# Patient Record
Sex: Female | Born: 1935
Health system: Southern US, Community
[De-identification: ages and names within clinical notes are randomized; demographics above are authoritative.]

## PROBLEM LIST (undated history)

## (undated) DIAGNOSIS — C801 Malignant (primary) neoplasm, unspecified: Secondary | ICD-10-CM

## (undated) DIAGNOSIS — T4145XA Adverse effect of unspecified anesthetic, initial encounter: Secondary | ICD-10-CM

## (undated) DIAGNOSIS — Z780 Asymptomatic menopausal state: Secondary | ICD-10-CM

## (undated) DIAGNOSIS — M1990 Unspecified osteoarthritis, unspecified site: Secondary | ICD-10-CM

## (undated) DIAGNOSIS — I10 Essential (primary) hypertension: Secondary | ICD-10-CM

## (undated) DIAGNOSIS — I83899 Varicose veins of unspecified lower extremities with other complications: Secondary | ICD-10-CM

## (undated) DIAGNOSIS — E7800 Pure hypercholesterolemia, unspecified: Secondary | ICD-10-CM

## (undated) DIAGNOSIS — K219 Gastro-esophageal reflux disease without esophagitis: Secondary | ICD-10-CM

## (undated) HISTORY — PX: TONSILLECTOMY: SUR1361

## (undated) HISTORY — DX: Asymptomatic menopausal state: Z78.0

## (undated) HISTORY — DX: Pure hypercholesterolemia, unspecified: E78.00

## (undated) HISTORY — PX: EYE SURGERY: SHX253

## (undated) HISTORY — DX: Essential (primary) hypertension: I10

---

## 1963-03-04 HISTORY — PX: APPENDECTOMY: SHX54

## 1963-03-04 HISTORY — PX: TUBAL LIGATION: SHX77

## 1978-03-03 HISTORY — PX: CHOLECYSTECTOMY: SHX55

## 1997-06-16 ENCOUNTER — Ambulatory Visit (HOSPITAL_COMMUNITY): Admission: RE | Admit: 1997-06-16 | Discharge: 1997-06-16 | Payer: Self-pay | Admitting: *Deleted

## 1998-06-29 ENCOUNTER — Ambulatory Visit (HOSPITAL_COMMUNITY): Admission: RE | Admit: 1998-06-29 | Discharge: 1998-06-29 | Payer: Self-pay | Admitting: *Deleted

## 1999-09-13 ENCOUNTER — Ambulatory Visit (HOSPITAL_COMMUNITY): Admission: RE | Admit: 1999-09-13 | Discharge: 1999-09-13 | Payer: Self-pay | Admitting: Internal Medicine

## 1999-09-13 ENCOUNTER — Encounter: Payer: Self-pay | Admitting: Internal Medicine

## 2000-02-18 ENCOUNTER — Encounter: Admission: RE | Admit: 2000-02-18 | Discharge: 2000-03-12 | Payer: Self-pay | Admitting: Orthopaedic Surgery

## 2000-10-01 ENCOUNTER — Ambulatory Visit (HOSPITAL_COMMUNITY): Admission: RE | Admit: 2000-10-01 | Discharge: 2000-10-01 | Payer: Self-pay | Admitting: Internal Medicine

## 2000-10-01 ENCOUNTER — Encounter: Payer: Self-pay | Admitting: Internal Medicine

## 2001-01-13 ENCOUNTER — Other Ambulatory Visit: Admission: RE | Admit: 2001-01-13 | Discharge: 2001-01-13 | Payer: Self-pay | Admitting: Obstetrics and Gynecology

## 2001-10-14 ENCOUNTER — Ambulatory Visit (HOSPITAL_COMMUNITY): Admission: RE | Admit: 2001-10-14 | Discharge: 2001-10-14 | Payer: Self-pay | Admitting: Internal Medicine

## 2001-10-14 ENCOUNTER — Encounter: Payer: Self-pay | Admitting: Internal Medicine

## 2002-01-24 ENCOUNTER — Encounter: Admission: RE | Admit: 2002-01-24 | Discharge: 2002-01-24 | Payer: Self-pay | Admitting: Obstetrics and Gynecology

## 2002-01-24 ENCOUNTER — Encounter: Payer: Self-pay | Admitting: Obstetrics and Gynecology

## 2002-01-25 ENCOUNTER — Other Ambulatory Visit: Admission: RE | Admit: 2002-01-25 | Discharge: 2002-01-25 | Payer: Self-pay | Admitting: Obstetrics and Gynecology

## 2002-03-03 HISTORY — PX: SIGMOIDOSCOPY: SUR1295

## 2002-11-02 ENCOUNTER — Ambulatory Visit (HOSPITAL_COMMUNITY): Admission: RE | Admit: 2002-11-02 | Discharge: 2002-11-02 | Payer: Self-pay | Admitting: Internal Medicine

## 2002-12-26 ENCOUNTER — Ambulatory Visit (HOSPITAL_COMMUNITY): Admission: RE | Admit: 2002-12-26 | Discharge: 2002-12-26 | Payer: Self-pay | Admitting: *Deleted

## 2002-12-26 ENCOUNTER — Encounter (INDEPENDENT_AMBULATORY_CARE_PROVIDER_SITE_OTHER): Payer: Self-pay

## 2003-01-01 ENCOUNTER — Ambulatory Visit (HOSPITAL_COMMUNITY): Admission: RE | Admit: 2003-01-01 | Discharge: 2003-01-01 | Payer: Self-pay | Admitting: Orthopedic Surgery

## 2003-01-12 ENCOUNTER — Ambulatory Visit (HOSPITAL_COMMUNITY): Admission: RE | Admit: 2003-01-12 | Discharge: 2003-01-12 | Payer: Self-pay | Admitting: Orthopedic Surgery

## 2003-01-12 ENCOUNTER — Ambulatory Visit (HOSPITAL_BASED_OUTPATIENT_CLINIC_OR_DEPARTMENT_OTHER): Admission: RE | Admit: 2003-01-12 | Discharge: 2003-01-12 | Payer: Self-pay | Admitting: Orthopedic Surgery

## 2003-01-23 ENCOUNTER — Encounter: Admission: RE | Admit: 2003-01-23 | Discharge: 2003-02-08 | Payer: Self-pay | Admitting: Orthopedic Surgery

## 2003-01-30 ENCOUNTER — Other Ambulatory Visit: Admission: RE | Admit: 2003-01-30 | Discharge: 2003-01-30 | Payer: Self-pay | Admitting: Obstetrics and Gynecology

## 2003-10-03 ENCOUNTER — Encounter: Admission: RE | Admit: 2003-10-03 | Discharge: 2003-11-08 | Payer: Self-pay | Admitting: Orthopedic Surgery

## 2003-12-01 ENCOUNTER — Ambulatory Visit (HOSPITAL_COMMUNITY): Admission: RE | Admit: 2003-12-01 | Discharge: 2003-12-01 | Payer: Self-pay | Admitting: Internal Medicine

## 2004-12-13 ENCOUNTER — Ambulatory Visit (HOSPITAL_COMMUNITY): Admission: RE | Admit: 2004-12-13 | Discharge: 2004-12-13 | Payer: Self-pay | Admitting: Internal Medicine

## 2004-12-25 ENCOUNTER — Encounter: Admission: RE | Admit: 2004-12-25 | Discharge: 2004-12-25 | Payer: Self-pay | Admitting: Internal Medicine

## 2005-03-03 HISTORY — PX: CATARACT EXTRACTION, BILATERAL: SHX1313

## 2005-12-16 ENCOUNTER — Ambulatory Visit (HOSPITAL_COMMUNITY): Admission: RE | Admit: 2005-12-16 | Discharge: 2005-12-16 | Payer: Self-pay | Admitting: Internal Medicine

## 2006-03-03 HISTORY — PX: KNEE ARTHROSCOPY: SUR90

## 2006-12-18 ENCOUNTER — Ambulatory Visit (HOSPITAL_COMMUNITY): Admission: RE | Admit: 2006-12-18 | Discharge: 2006-12-18 | Payer: Self-pay | Admitting: Internal Medicine

## 2007-12-20 ENCOUNTER — Ambulatory Visit (HOSPITAL_COMMUNITY): Admission: RE | Admit: 2007-12-20 | Discharge: 2007-12-20 | Payer: Self-pay | Admitting: Internal Medicine

## 2008-12-20 ENCOUNTER — Ambulatory Visit (HOSPITAL_COMMUNITY): Admission: RE | Admit: 2008-12-20 | Discharge: 2008-12-20 | Payer: Self-pay | Admitting: Internal Medicine

## 2009-12-24 ENCOUNTER — Ambulatory Visit (HOSPITAL_COMMUNITY): Admission: RE | Admit: 2009-12-24 | Discharge: 2009-12-24 | Payer: Self-pay | Admitting: Internal Medicine

## 2010-01-03 ENCOUNTER — Encounter: Admission: RE | Admit: 2010-01-03 | Discharge: 2010-01-03 | Payer: Self-pay | Admitting: Internal Medicine

## 2010-03-03 DIAGNOSIS — I83899 Varicose veins of unspecified lower extremities with other complications: Secondary | ICD-10-CM

## 2010-03-03 HISTORY — DX: Varicose veins of unspecified lower extremity with other complications: I83.899

## 2010-07-19 NOTE — Op Note (Signed)
NAME:  Melissa Glenn, Melissa Glenn                        ACCOUNT NO.:  [id number]   MEDICAL RECORD NO.:  [id number]                   PATIENT TYPE:  AMB   LOCATION:  DSC                                  FACILITY:  [facility]   PHYSICIAN:  Rodney A. Chaney Malling, M.D.           DATE OF BIRTH:  [birth year]   DATE OF PROCEDURE:  01/12/2003  DATE OF DISCHARGE:                                 OPERATIVE REPORT   PREOPERATIVE DIAGNOSIS:  Tear medial meniscus posterior horn right knee.   POSTOPERATIVE DIAGNOSIS:  Tear posterior horn medial meniscus right knee  with early osteoarthritis of the patellofemoral joint, right knee.   OPERATION:  Arthroscopy; debridement posterior horn of the medial meniscus  right knee.   SURGEON:  Lenard Galloway. Chaney Malling, M.D.   ANESTHESIA:  MAC.   PATHOLOGY:  With the arthoscope in the knee, very careful examination of the  right knee was undertaken. The patellofemoral joint showed some early  osteoarthritic changes of the patellofemoral junction. In the lateral  compartment, articular cartilage over the lateral femoral condyle and the  lateral tibial plateau appeared fairly normal as did the lateral meniscus.  The anterior cruciate ligament was then visualized and this was normal. In  the medial compartment, there were some other osteoarthritic changes in the  medial compartment but there was some fraying and tearing of the posterior  horn of the medial meniscus. The tear started at the junction of the mid and  posterior thirds and extended to the posterior attachment.   DESCRIPTION OF PROCEDURE:  The patient was placed on the operating table in  the supine position, pneumatic tourniquet about the right upper thigh. The  right leg was placed in a leg holder and entire right lower extremity  prepped with Duraprep and draped out in the usual manner. Marcaine had been  placed in the knee and Xylocaine and epinephrine used to infiltrate the  puncture wounds. An infusion cannula  was placed in the superomedial pouch  and the knee distended with saline. Anteromedial and anterolateral portals  were made and the arthroscope was introduced. The findings were described as  above. Most of the pathology seen in the medial compartment. Through both  the medial and lateral portals, a series of baskets were inserted and a torn  portion of the posterior meniscus was debrided aggressively. This was  followed with the intraarticular shaver and the rim was smooth and balanced  with nice transition of the mid third to the medial meniscus. Complete  decompression of the torn portion of the meniscus was achieved very nicely.  Again there was some fraying of the articular cartilage off the weight  bearing area of the medial femoral condyle and some changes about the medial  tibial plateau. The knee was then filled with Marcaine, a large bulky  pressure dressing applied. The patient returned to the recovery room in  excellent condition. Technically this procedure went extremely well.  DRAINS:  None.   COMPLICATIONS:  None.                                              Rodney A. Chaney Malling, M.D.   RAM/MEDQ  D:  01/12/2003  T:  01/12/2003  Job:  [id number]

## 2010-07-19 NOTE — Op Note (Signed)
   NAME:  Melissa Glenn, Melissa Glenn                        ACCOUNT NO.:  [id number]   MEDICAL RECORD NO.:  [id number]                   PATIENT TYPE:  AMB   LOCATION:  ENDO                                 FACILITY:  [facility]   PHYSICIAN:  Georgiana Spinner, M.D.                 DATE OF BIRTH:  Nov 04, 1930   DATE OF PROCEDURE:  12/26/2002  DATE OF DISCHARGE:                                 OPERATIVE REPORT   PROCEDURE:  Upper endoscopy.   INDICATIONS:  Gastroesophageal reflux disease.   ANESTHESIA:  Demerol 50 mg, Versed 7 mg.   DESCRIPTION OF PROCEDURE:  With the patient mildly sedated in the left  lateral decubitus position, the Olympus videoscopic endoscope was inserted  in the mouth and passed under direct vision through the esophagus, which  appeared normal until it reached the distal esophagus and there was a large  esophageal diverticulum just above the GE junction which may explain why the  patient has dysphagia for salad and vegetable-type material.  We entered  into the stomach through a small hiatal hernia.  The  fundus, body, antrum,  duodenal bulb, second portion of the duodenum all appeared normal.  From  this point, the endoscope was slowly withdrawn taking circumferential views  of the duodenal mucosa until the endoscope then pulled back into the  stomach, placed in retroflexion and viewed the stomach from below.  The  endoscope was then straightened, withdrawn, taking circumferential views of  the remaining gastric and esophageal mucosa, stopping in the high fundus of  the stomach where it appeared to be somewhat erythematous.  I could not tell  if this was due to true erythema or coloration from the endoscope.  Biopsies  were taken.  The endoscope was withdrawn.  The patient's vital signs and  pulse oximetry remained stable.  The patient tolerated the procedure well  without apparent complications.   FINDINGS:  1. Large diverticulum of the esophagus photographed.  2. Hiatal  hernia, small.  3. Question of gastritis, biopsied.   PLAN:  Await biopsy report.  The patient will call me for results and follow  up with me as an outpatient.                                                Georgiana Spinner, M.D.    GMO/MEDQ  D:  12/26/2002  T:  12/26/2002  Job:  [id number]

## 2010-07-19 NOTE — Op Note (Signed)
   NAME:  Melissa Glenn, Melissa Glenn                        ACCOUNT NO.:  [id number]   MEDICAL RECORD NO.:  [id number]                   PATIENT TYPE:  AMB   LOCATION:  ENDO                                 FACILITY:  [facility]   PHYSICIAN:  Georgiana Spinner, M.D.                 DATE OF BIRTH:  11/25/1930   DATE OF PROCEDURE:  12/26/2002  DATE OF DISCHARGE:                                 OPERATIVE REPORT   PROCEDURE:  Colonoscopy.   INDICATIONS:  Cancer screening.   ANESTHESIA:  Demerol 50, Versed 3 mg.   DESCRIPTION OF PROCEDURE:  With the patient mildly sedated in the left  lateral decubitus position, the Olympus video colonoscope was inserted in  the rectum and passed under direct vision to the cecum, identified by the  ileocecal valve and appendiceal orifice, both of which were photographed.   From this point the colonoscope was slowly withdrawn, taking circumferential  views of the colonic mucosa, stopping only in the rectum which appeared  normal on direct and retroflex view. The endoscope was straightened and  withdrawn.   The patient's vital signs on pulse oximetry remained stable. The patient  tolerated the procedure well without apparent complications.   FINDINGS:  Unremarkable colonoscopic examination.   PLAN:  Repeat  possibly in 5 years.   PLAN:  Have the patient follow up with me as an outpatient.                                                Georgiana Spinner, M.D.    GMO/MEDQ  D:  12/26/2002  T:  12/26/2002  Job:  [id number]

## 2010-12-24 ENCOUNTER — Other Ambulatory Visit (HOSPITAL_COMMUNITY): Payer: Self-pay | Admitting: Internal Medicine

## 2010-12-24 DIAGNOSIS — Z1231 Encounter for screening mammogram for malignant neoplasm of breast: Secondary | ICD-10-CM

## 2011-01-16 ENCOUNTER — Ambulatory Visit (HOSPITAL_COMMUNITY): Payer: Medicare Other | Attending: Internal Medicine

## 2011-02-19 ENCOUNTER — Ambulatory Visit (HOSPITAL_COMMUNITY)
Admission: RE | Admit: 2011-02-19 | Discharge: 2011-02-19 | Disposition: A | Discharge status: Home / Self Care | Payer: Medicare Other | Source: Ambulatory Visit | Attending: Internal Medicine | Admitting: Internal Medicine

## 2011-02-19 DIAGNOSIS — Z1231 Encounter for screening mammogram for malignant neoplasm of breast: Secondary | ICD-10-CM

## 2011-03-25 ENCOUNTER — Encounter: Payer: Self-pay | Admitting: Gynecology

## 2011-03-25 ENCOUNTER — Ambulatory Visit (INDEPENDENT_AMBULATORY_CARE_PROVIDER_SITE_OTHER): Payer: Medicare Other | Admitting: Gynecology

## 2011-03-25 VITALS — BP 128/76 | Ht 63.25 in | Wt 173.0 lb

## 2011-03-25 DIAGNOSIS — E7800 Pure hypercholesterolemia, unspecified: Secondary | ICD-10-CM | POA: Insufficient documentation

## 2011-03-25 DIAGNOSIS — N8111 Cystocele, midline: Secondary | ICD-10-CM

## 2011-03-25 DIAGNOSIS — IMO0002 Reserved for concepts with insufficient information to code with codable children: Secondary | ICD-10-CM

## 2011-03-25 NOTE — Patient Instructions (Signed)
Hysterectomy A hysterectomy is a procedure where your womb (uterus) is surgically taken out. It will no longer be possible to have menstrual periods or to become pregnant. Removal of the tubes and ovaries (bilateral salpingo-oopherectomy) can be done during this operation as well.   An abdominal hysterectomy is done through a large cut (incision) in the abdomen made by the surgeon.   A vaginal hysterectomy is done through the vagina. There are no abdominal incisions, but there will be incisions on the inside of the vagina.   A laparoscopic assisted vaginal hysterectomy is done through 2 or 3 small incisions in the abdomen, but the uterus is removed and passed through the vagina.  Women who are going to have a hysterectomy should be tested first to make sure there is no cancer of the cervix or in the uterus. INDICATIONS FOR HYSTERECTOMY:  Persistent abnormal bleeding.   Lasting (chronic) pelvic pain.   Endometriosis. This is when the lining of the uterus (endometrium) is misplaced outside of its normal location.   Adenomyosis. This is when the endometrium tissue grows in the muscle of the uterus.   Uterine prolapse. This is when the uterus falls down into the vagina.   Cancer of the uterus or cervix that requires a radical hysterectomy, removal of the uterus, tubes, ovaries, and surrounding lymph nodes.  LET YOUR CAREGIVER KNOW ABOUT  Cystocele Repair A cystocele is a bulging, drooping hernia or break (rupture) of bladder tissue into the birth canal (vagina). This bulging or rupture occurs on the top front wall of the vagina. CAUSES  Cystocele is associated with weakness of the top front wall of the vagina due to stretching and tearing of the ligaments and muscles in the area. This is often the result of:  Multiple childbirths.   Continuous heavy lifting.   Chronic cough from asthma, emphysema, or smoking.   Being overweight.   Changes from aging.   Previous surgery in the  vaginal area.   Menopause with loss of estrogen hormone and weakening of the ligaments and muscles around the bladder.  SYMPTOMS   Uncontrolled loss of urine (incontinence) with cough, sneeze, or exercise.   Pelvic pressure.   Frequency or urgency to urinate because of inability to completely empty the bladder.   Bladder infections.   Needing to push on the upper vagina to help yourself pass urine.  DIAGNOSIS  A cystocele can be diagnosed by doing a pelvic exam and observing the top of the vagina drooping or bulging into or out of the vagina. TREATMENT  Surgical options:  Cystocele repair is surgery that removes the hernia.   There are also different "sling" operations that may be used.  Discuss the different types of surgeries to repair a cystocele with your caregiver. Your caregiver will decide what type of surgery will be best in your case. Nonsurgical options:  Kegel exercises. This helps strengthen and tighten the muscles and tissue in and around the bladder and vagina. This may help with mild cases of cystocele.   A pessary may help the cystocele. A pessary is a plastic or rubber device that lifts the bladder into place. A pessary must be fitted by a doctor.   Tampons or diaphragms that lift the bladder into place are sometimes helpful with a minor or small cystocele.   Estrogen may help with mild cases in menopausal and aging women.  LET YOUR CAREGIVER KNOW ABOUT:   Allergies to food or medicine.   Medicines taken, including vitamins,  herbs, eyedrops, over-the-counter medicines, and creams.   Use of steroids (by mouth or creams).   Previous problems with anesthetics or numbing medicines.   History of bleeding problems or blood clots.   Previous surgery.   Other health problems, including diabetes and kidney problems.   Possibility of pregnancy, if this applies.  RISKS AND COMPLICATIONS  All surgery is associated with risks.  There are risks with a general  anesthesia. You should discuss this with your caregiver.   With spinal or epidural anesthesia, there may be an area that is not numbed, and you could feel pain.   Headache could occur with a spinal or epidural anesthetic.   The catheter you will have after surgery may not work properly or may get blocked and need to be replaced.   Excessive bleeding.   Infection.   Injury to surrounding structures.   Recurrence of the cystocele.   Surgery may not get rid of your symptoms.  BEFORE THE PROCEDURE   Do not take aspirin or blood thinners for 1 week prior to surgery, unless instructed otherwise.   Do not eat or drink anything after midnight the night before surgery.   Let your caregiver know if you develop a cold or other infectious problems prior to surgery.   If being admitted the day of surgery, you should be present 1 hour prior to your procedure or as directed by your caregiver.   Plan and arrange for help when you go home from the hospital.   If you smoke, do not smoke for at least 2 weeks before the surgery.   Do not drink any alcohol for 3 days before the surgery.  PROCEDURE  You will be given an anesthetic to prevent you from feeling pain during surgery. This may be a general anesthetic that puts you to sleep, or a spinal or epidural anesthetic. You will be asleep or be numbed through the entire procedure. During cystocele repair, tissue is pulled from the sides and around the top of the vagina to lift up the hernia. This removes the hernia so that the top of the vagina does not fall into the opening of the vagina. AFTER THE PROCEDURE  After surgery, you will be taken to the recovery room where a nurse will take care of you, checking your breathing, blood pressure, pulse, and your progress. When your caregiver feels you are stable, you will be taken to your room. You will have a drainage tube (Foley catheter) that will drain your bladder for 2 to 7 days or longer, until your  bladder is working properly. This catheter is placed prior to surgery to help keep your bladder empty and out of the way during the procedure. After surgery, this will make passing your urine easier. The catheter will be removed when you can easily pass urine without this assistance. You may have gauze packing in the vagina that will be removed 1 to 2 days after the surgery. Usually, you will be given a medicine (antibiotic) that kills germs. You will be given pain medicine as needed. You can usually go home in 3 to 5 days. HOME CARE INSTRUCTIONS   Do not take baths. Take showers until your caregiver informs you otherwise.   Take antibiotics as directed by your caregiver.   Exercise as instructed. Do not perform exercises which increase the pressure inside your belly (abdomen), such as sit-ups or lifting weights, until your caregiver has given permission. Walking exercise is preferred.   Only take  over-the-counter or prescription medicines for pain and discomfort as directed by your caregiver.   Do not drink alcohol while taking pain medicine.   Do not lift anything over 5 pounds.   Do not drive until your caregiver gives you permission.   Get plenty of rest and sleep.   Have someone help with your household chores for 1 to 2 weeks.   If you develop constipation, you may take a mild laxative with your caregiver's permission. Eating bran foods and drinking enough water and fluids to keep your urine clear or pale yellow helps with constipation.   Do not take aspirin. It may cause bleeding.   You may resume normal diet and unstrenuous activities as directed.   Do not douche, use tampons, or engage in intercourse until your surgeon has given permission.   Change bandages (dressings) as directed.   Make and keep all your postoperative appointments.  SEEK MEDICAL CARE IF:   You have abnormal vaginal discharge.   You develop a rash.   You are having a reaction to your medicine.   You  develop nausea or vomiting.  SEEK IMMEDIATE MEDICAL CARE IF:   You have redness, swelling, or increasing pain in the vaginal area.   You notice pus coming from the vagina.   You have a fever.   You notice a bad smell coming from the vagina.   You have increasing abdominal pain.   You have frequent urination or you notice burning during urination.   You notice blood in your urine.   You have excessive vaginal bleeding.   You cannot urinate.  MAKE SURE YOU:   Understand these instructions.   Will watch your condition.   Will get help right away if you are not doing well or get worse.  Document Released: 02/15/2000 Document Revised: 10/30/2010 Document Reviewed: 05/17/2009 [facility] Patient Information 2012 [place], [place].  Allergies (especially to medicines).   Medications taken including herbs, eye drops, over the counter medications, and creams.   Use of steroids (by mouth or creams).   Past problems with anesthetics or numbing medication.   Possibility of pregnancy, if this applies.   History of blood clots (thrombophlebitis).   History of bleeding or blood problems.   Past surgery.   Other health problems.  RISKS AND COMPLICATIONS All surgeries can have risks. Some of these risks are:  A lot of bleeding.   Injury to surrounding organs.   Infection.   Blood clots of the leg, heart, or lung.   Problems with anesthesia.   Early menopause.  BEFORE THE PROCEDURE  Do not take aspirin or blood thinners for a week before surgery, or as directed by your caregiver.   Do not eat or drink anything after midnight the night before surgery, or as directed by your caregiver.   Let your caregiver know if you get a cold or other infectious problems before surgery.   If you are being admitted the day of surgery, you should be present 60 minutes before your procedure or as told by your caregiver.  PROCEDURE   An IV (intravenous) will be placed in your arm. You  will be given a drug to make you sleep (anesthetic) during surgery. You may be given a shot in the spine (spinal anesthesia) that will numb your body from the waist down. This will keep you pain-free during surgery.   When you wake from surgery, you will have the IV and a long, narrow, hollow tube (urinary catheter) draining  the bladder for 1 or 2 days after surgery. This will make passing your urine easier. It also helps by keeping your bladder empty during surgery.   After surgery, you will be taken to the recovery area where a nurse will watch and check your progress. Once you wake up, stable and taking fluids well, without other problems, you will be allowed to return to your room. Usually you will remain in the hospital 3 to 5 days. You may be given an antibiotic during and after the surgery and when you go home. Pain medication will be ordered by your caregiver while you are in the hospital and when you go home.  HOME CARE INSTRUCTIONS  Healing will take time. You will have discomfort, tenderness, swelling, and bruising at the operative site for a couple of weeks. This is normal and will get better as time goes on.   Only take over-the-counter or prescription medicines for pain, discomfort, or fever as directed by your caregiver.   Do not take aspirin. It can cause bleeding.   Do not drive when taking pain medication.   Follow your caregiver's advice regarding diet, exercise, lifting, driving, and general activities.   Resume your usual diet as directed and allowed.   Get plenty of rest and sleep.   Do not douche, use tampons, or have sexual intercourse until your caregiver gives you permission.   Change your bandages (dressings) as directed.   Take your temperature twice a day. Write it down.   Your caregiver may recommend showers instead of baths for a few weeks.   Do not drink alcohol until your caregiver gives you permission.   If you develop constipation, you may take a mild  laxative with your caregiver's permission. Bran foods and drinking fluids helps with constipation problems.   Try to have someone home with you for a week or two to help with the household activities.   Make sure you and your family understands everything about your operation and recovery.   Do not sign any legal documents until you feel normal again.   Keep all your follow-up appointments as recommended by your caregiver.  SEEK MEDICAL CARE IF:   There is swelling, redness, or increasing pain in the wound area.   Pus is coming from the wound.   You notice a bad smell from the wound or surgical dressing.   You have pain, redness, and swelling from the intravenous site.   The wound is breaking open (the edges are not staying together).   You feel dizzy or feel like fainting.   You develop pain or bleeding when you urinate.   You develop diarrhea.   You develop nausea and vomiting.   You develop abnormal vaginal discharge.   You develop a rash.   You have any type of abnormal reaction or develop an allergy to your medication.   You need stronger pain medication for your pain.  SEEK IMMEDIATE MEDICAL CARE IF:  You have a fever.   You develop abdominal pain.   You develop chest pain.   You develop shortness of breath.   You pass out.   You develop pain, swelling, or redness of your leg.   You develop heavy vaginal bleeding with or without blood clots.  Document Released: 08/13/2000 Document Revised: 10/30/2010 Document Reviewed: 07/01/2007 [facility] Patient Information 2012 [place], [place].

## 2011-03-25 NOTE — Progress Notes (Signed)
Patient is a 78 year old who is new to the practice and presented to the office today with a chief complaint of a bulging vaginal pressure like sensation over the past year. Patient denied any stress urinary incontinence, no rectal incontinence. Patient is a gravida 5 para 6 (twins) all deliveries were vaginal. Patient stated that several years ago she had been on hormone replacement therapy. She has no history of a prior hysterectomy. Her last Pap smear gynecological examination was last year reported to be normal. Patient with no prior abnormal Pap smears reported. Her mammogram was in November of 2012 reported to be normal. Patient frequently does her self breast examinations. Her last bone density study was 6 months ago ordered by Dr. Juline Patch who is been monitoring her for type 2 diabetes and hypercholesterolemia. She scheduled to see them next week at which time her lab work will be drawn at that time. Patient is a retired Scientist, forensic.  Patient was examined in the supine and erect position:  Pelvic: Bartholin urethra Skene glands within normal limits Vagina: Second-degree cystocele no other lesions noted Cervix: No gross lesions on inspection Uterus: Anteverted no palpable masses or tenderness Adnexa: No palpable masses or tenderness Rectal: No rectocele  Patient was reexamined in the erect position mild pelvic relaxation mostly second-degree cystocele no evidence of enterocele.  We had a lengthy discussion on pelvic organ prolapse/cystocele and treatment options. We discussed TVH with BSO anterior repair versus anterior repair only. We discussed the risks benefits and pros and cons of the operation to include injury to the bladder, rectum. Also the possibility need to go home with a leg bag if the bladder is not reduced and is to be immediately postop or in the event of any bladder injury. We also discussed that in the event that there would be hemorrhage and she would need blood or blood  products there is always the potential risk of anaphylactic reaction hepatitis and AIDS. Also we discussed that at time of vaginal hysterectomy due to the fact that she is 78 years of age and has atrophic ovaries we may not be able to see them to removing. We also discussed about placing her on estrogen vaginal cream for at least a month before procedure to improve the vaginal epithelium to help with the dissection at time of her surgery. Literature information was provided and all the above was discussed all questions are answered. We'll wait for treat her to the material provided and we will call her and schedule her surgery accordingly and I would need to see her for preoperative examination the week before surgery. She has stated that in the past they had trouble with intubation so she would be a candidate for regional anesthesia. We'll have her discuss this with the anesthesiologist prior to her surgery as well.

## 2011-03-26 ENCOUNTER — Telehealth: Payer: Self-pay

## 2011-03-26 NOTE — Telephone Encounter (Signed)
Left message for patient to call me to discuss insurance benefits and her decision regarding surgery.

## 2011-04-09 ENCOUNTER — Other Ambulatory Visit: Payer: Self-pay | Admitting: Internal Medicine

## 2011-04-09 DIAGNOSIS — I83899 Varicose veins of unspecified lower extremities with other complications: Secondary | ICD-10-CM

## 2011-04-09 DIAGNOSIS — I83819 Varicose veins of unspecified lower extremities with pain: Secondary | ICD-10-CM

## 2011-04-23 ENCOUNTER — Ambulatory Visit
Admission: RE | Admit: 2011-04-23 | Discharge: 2011-04-23 | Disposition: A | Discharge status: Home / Self Care | Payer: Medicare Other | Source: Ambulatory Visit | Attending: Internal Medicine | Admitting: Internal Medicine

## 2011-04-23 ENCOUNTER — Other Ambulatory Visit: Payer: Self-pay | Admitting: Internal Medicine

## 2011-04-23 DIAGNOSIS — I83899 Varicose veins of unspecified lower extremities with other complications: Secondary | ICD-10-CM

## 2011-04-23 DIAGNOSIS — I83819 Varicose veins of unspecified lower extremities with pain: Secondary | ICD-10-CM

## 2011-04-23 HISTORY — DX: Varicose veins of unspecified lower extremity with other complications: I83.899

## 2011-04-24 ENCOUNTER — Other Ambulatory Visit: Payer: Self-pay | Admitting: Interventional Radiology

## 2011-07-08 ENCOUNTER — Telehealth: Payer: Self-pay | Admitting: Emergency Medicine

## 2011-07-08 NOTE — Telephone Encounter (Signed)
S/W PT. SHE DOES NOT WANT TO PURSUE ANY TREATMENT OR F/U AS OF THIS MOMENT. SHE IS DOING WELL WITH THE COMPRESSION STOCKINGS AND WILL CALL us IF NEEDED.

## 2012-03-17 ENCOUNTER — Other Ambulatory Visit (HOSPITAL_COMMUNITY): Payer: Self-pay | Admitting: Internal Medicine

## 2012-03-17 DIAGNOSIS — Z1231 Encounter for screening mammogram for malignant neoplasm of breast: Secondary | ICD-10-CM

## 2012-03-19 ENCOUNTER — Ambulatory Visit (HOSPITAL_COMMUNITY)
Admission: RE | Admit: 2012-03-19 | Discharge: 2012-03-19 | Disposition: A | Discharge status: Home / Self Care | Payer: Medicare Other | Source: Ambulatory Visit | Attending: Internal Medicine | Admitting: Internal Medicine

## 2012-03-19 DIAGNOSIS — Z1231 Encounter for screening mammogram for malignant neoplasm of breast: Secondary | ICD-10-CM

## 2012-03-31 ENCOUNTER — Encounter: Payer: Medicare Other | Admitting: Gynecology

## 2012-04-09 ENCOUNTER — Ambulatory Visit (INDEPENDENT_AMBULATORY_CARE_PROVIDER_SITE_OTHER): Payer: Medicare Other | Admitting: Gynecology

## 2012-04-09 ENCOUNTER — Encounter: Payer: Self-pay | Admitting: Gynecology

## 2012-04-09 VITALS — BP 128/74 | Ht 63.0 in | Wt 173.0 lb

## 2012-04-09 DIAGNOSIS — E119 Type 2 diabetes mellitus without complications: Secondary | ICD-10-CM | POA: Insufficient documentation

## 2012-04-09 DIAGNOSIS — L304 Erythema intertrigo: Secondary | ICD-10-CM

## 2012-04-09 DIAGNOSIS — L538 Other specified erythematous conditions: Secondary | ICD-10-CM

## 2012-04-09 DIAGNOSIS — N816 Rectocele: Secondary | ICD-10-CM

## 2012-04-09 DIAGNOSIS — Z780 Asymptomatic menopausal state: Secondary | ICD-10-CM

## 2012-04-09 DIAGNOSIS — N8111 Cystocele, midline: Secondary | ICD-10-CM

## 2012-04-09 MED ORDER — NONFORMULARY OR COMPOUNDED ITEM: Status: DC

## 2012-04-09 MED ORDER — NYSTATIN-TRIAMCINOLONE 100000-0.1 UNIT/GM-% EX OINT: TOPICAL_OINTMENT | Freq: Two times a day (BID) | CUTANEOUS | Status: DC

## 2012-04-09 NOTE — Progress Notes (Signed)
Melissa Glenn September 19, 1930 [id number]   History:    79 y.o.  presented to the office today with a complaint of worsening bulging of her vagina with pressure like sensation. She minimally for leaks urine. Patient is a gravida 5 para 6 (twins) all deliveries were vaginal. Patient stated that several years ago she had been on hormone replacement therapy. She has no history of a prior hysterectomy. Patient was seen last year as a new patient to the practice. She stated the year prior she had a normal Pap smear and she has always had normal Pap smears. Her last mammogram was January of 2014 reported to be normal. Patient does her monthly self breast examination. Last colonoscopy normal in 2010. Patient's primary physician is Dr. Ricki Miller who has been treating patient for hyperlipidemia and type 2 diabetes and has been doing her lab work. Patient also was complaining of an abdominal rash. Patient states that she is very active and is taking her calcium and vitamin D. She stated that her primary physician had ordered a bone density study in 2013 which was normal for which we do not have the results.   Past medical history,surgical history, family history and social history were all reviewed and documented in the EPIC chart.  Gynecologic History No LMP recorded. Patient is postmenopausal. Contraception: post menopausal status Last Pap: 2012. Results were: normal Last mammogram: 2014. Results were: normal  Obstetric History OB History    Grav Para Term Preterm Abortions TAB SAB Ect Mult Living   5 5 5      1 6      # Outc Date GA Lbr Len/2nd Wgt Sex Del Anes PTL Lv   1 TRM     M SVD  No Yes   2 TRM     F SVD  No Yes   3A TRM     M SVD  No Yes   3B      M SVD  No Yes   Comments: DIED AT 64 MONTH OLD   4 TRM     M SVD  No Yes   5 TRM     F SVD  No Yes       ROS: A ROS was performed and pertinent positives and negatives are included in the history.  GENERAL: No fevers or chills. HEENT: No change in  vision, no earache, sore throat or sinus congestion. NECK: No pain or stiffness. CARDIOVASCULAR: No chest pain or pressure. No palpitations. PULMONARY: No shortness of breath, cough or wheeze. GASTROINTESTINAL: No abdominal pain, nausea, vomiting or diarrhea, melena or bright red blood per rectum. GENITOURINARY: No urinary frequency, urgency, hesitancy or dysuria. MUSCULOSKELETAL: No joint or muscle pain, no back pain, no recent trauma. DERMATOLOGIC: No rash, no itching, no lesions. ENDOCRINE: No polyuria, polydipsia, no heat or cold intolerance. No recent change in weight. HEMATOLOGICAL: No anemia or easy bruising or bleeding. NEUROLOGIC: No headache, seizures, numbness, tingling or weakness. PSYCHIATRIC: No depression, no loss of interest in normal activity or change in sleep pattern.     Exam: chaperone present  BP 128/74  Ht 5\' 3"  (1.6 m)  Wt 173 lb (78.472 kg)  BMI 30.65 kg/m2  Body mass index is 30.65 kg/(m^2).  General appearance : Well developed well nourished female. No acute distress HEENT: Neck supple, trachea midline, no carotid bruits, no thyroidmegaly Lungs: Clear to auscultation, no rhonchi or wheezes, or rib retractions  Heart: Regular rate and rhythm, no murmurs or gallops Breast:Examined in sitting  and supine position were symmetrical in appearance, no palpable masses or tenderness,  no skin retraction, no nipple inversion, no nipple discharge, no skin discoloration, no axillary or supraclavicular lymphadenopathy Abdomen: no palpable masses or tenderness, no rebound or guarding Extremities: no edema or skin discoloration or tenderness  Pelvic:  Bartholin, Urethra, Skene Glands: Within normal limits             Vagina: Second to third-degree cystocele questionable first degree rectocele mild uterine descensus  Cervix: No gross lesions or discharge  Uterus  axial, normal size, shape and consistency, non-tender and mobile  Adnexa  Without masses or tenderness  Anus and  perineum  normal   Rectovaginal  normal sphincter tone without palpated masses or tenderness             Hemoccult cards provided     Assessment/Plan:  79 y.o. female with worsening uterine pressure as a result of her third-degree cystocele mild uterine descensus and mild first degree rectocele. Patient with some urgency although rarely reports any stress urinary incontinence. Last year we had discussed pelvic organ prolapse/cystocele and treatment options. We discussed TVH with BSO anterior repair with possible posterior repair versus anterior repair only. We discussed the risks benefits and pros and cons of the operation to include injury to the bladder, rectum. Also the possibility need to go home with a leg bag if the bladder does not regain its tonicity immediately postop or in the event of any bladder injury. We also discussed that in the event that there would be hemorrhage and she would need blood or blood products there is always the potential risk of anaphylactic reaction hepatitis and AIDS. Also we discussed that at time of vaginal hysterectomy due to the fact that she is 79 years of age and has atrophic ovaries we may not be able to see them to removing. We also discussed about placing her on estrogen vaginal cream for at least a month before procedure to improve the vaginal epithelium to help with the dissection at time of her surgery. She will be scheduled for a urodynamic evaluation to see after reducing the cystocele with a full bladder she does not leak. I will see her then to discuss the results one week after the urodynamic test and go over her surgery as described above. No Pap smear done today. New guidelines discussed. She was reminded take her calcium and vitamin D with regular exercise for osteoporosis prevention. I've asked her to obtain a copy of her last bone density study so that we can scan into her system. Hemoccult cards were provided for her to symmetrical office for testing.  She was prescribed estradiol 0.02% 1 mL prefilled applicator to apply each bedtime for one week then 3 times a week until her surgery. Risks benefits and pros and cons were discussed.  Ok Edwards MD, 12:58 PM 04/09/2012

## 2012-04-09 NOTE — Patient Instructions (Addendum)
Cystocele Repair A cystocele is a bulging, drooping hernia or break (rupture) of bladder tissue into the birth canal (vagina). This bulging or rupture occurs on the top front wall of the vagina. CAUSES  Cystocele is associated with weakness of the top front wall of the vagina due to stretching and tearing of the ligaments and muscles in the area. This is often the result of:  Multiple childbirths.  Continuous heavy lifting.  Chronic cough from asthma, emphysema, or smoking.  Being overweight.  Changes from aging.  Previous surgery in the vaginal area.  Menopause with loss of estrogen hormone and weakening of the ligaments and muscles around the bladder. SYMPTOMS   Uncontrolled loss of urine (incontinence) with cough, sneeze, or exercise.  Pelvic pressure.  Frequency or urgency to urinate because of inability to completely empty the bladder.  Bladder infections.  Needing to push on the upper vagina to help yourself pass urine. DIAGNOSIS  A cystocele can be diagnosed by doing a pelvic exam and observing the top of the vagina drooping or bulging into or out of the vagina. TREATMENT  Surgical options:  Cystocele repair is surgery that removes the hernia.  There are also different "sling" operations that may be used. Discuss the different types of surgeries to repair a cystocele with your caregiver. Your caregiver will decide what type of surgery will be best in your case. Nonsurgical options:  Kegel exercises. This helps strengthen and tighten the muscles and tissue in and around the bladder and vagina. This may help with mild cases of cystocele.  A pessary may help the cystocele. A pessary is a plastic or rubber device that lifts the bladder into place. A pessary must be fitted by a doctor.  Tampons or diaphragms that lift the bladder into place are sometimes helpful with a minor or small cystocele.  Estrogen may help with mild cases in menopausal and aging women. LET YOUR  CAREGIVER KNOW ABOUT:   Allergies to food or medicine.  Medicines taken, including vitamins, herbs, eyedrops, over-the-counter medicines, and creams.  Use of steroids (by mouth or creams).  Previous problems with anesthetics or numbing medicines.  History of bleeding problems or blood clots.  Previous surgery.  Other health problems, including diabetes and kidney problems.  Possibility of pregnancy, if this applies. RISKS AND COMPLICATIONS  All surgery is associated with risks.  There are risks with a general anesthesia. You should discuss this with your caregiver.  With spinal or epidural anesthesia, there may be an area that is not numbed, and you could feel pain.  Headache could occur with a spinal or epidural anesthetic.  The catheter you will have after surgery may not work properly or may get blocked and need to be replaced.  Excessive bleeding.  Infection.  Injury to surrounding structures.  Recurrence of the cystocele.  Surgery may not get rid of your symptoms. BEFORE THE PROCEDURE   Do not take aspirin or blood thinners for 1 week prior to surgery, unless instructed otherwise.  Do not eat or drink anything after midnight the night before surgery.  Let your caregiver know if you develop a cold or other infectious problems prior to surgery.  If being admitted the day of surgery, you should be present 1 hour prior to your procedure or as directed by your caregiver.  Plan and arrange for help when you go home from the hospital.  If you smoke, do not smoke for at least 2 weeks before the surgery.  Do not   drink any alcohol for 3 days before the surgery. PROCEDURE  You will be given an anesthetic to prevent you from feeling pain during surgery. This may be a general anesthetic that puts you to sleep, or a spinal or epidural anesthetic. You will be asleep or be numbed through the entire procedure. During cystocele repair, tissue is pulled from the sides and  around the top of the vagina to lift up the hernia. This removes the hernia so that the top of the vagina does not fall into the opening of the vagina. AFTER THE PROCEDURE  After surgery, you will be taken to the recovery room where a nurse will take care of you, checking your breathing, blood pressure, pulse, and your progress. When your caregiver feels you are stable, you will be taken to your room. You will have a drainage tube (Foley catheter) that will drain your bladder for 2 to 7 days or longer, until your bladder is working properly. This catheter is placed prior to surgery to help keep your bladder empty and out of the way during the procedure. After surgery, this will make passing your urine easier. The catheter will be removed when you can easily pass urine without this assistance. You may have gauze packing in the vagina that will be removed 1 to 2 days after the surgery. Usually, you will be given a medicine (antibiotic) that kills germs. You will be given pain medicine as needed. You can usually go home in 3 to 5 days. HOME CARE INSTRUCTIONS   Do not take baths. Take showers until your caregiver informs you otherwise.  Take antibiotics as directed by your caregiver.  Exercise as instructed. Do not perform exercises which increase the pressure inside your belly (abdomen), such as sit-ups or lifting weights, until your caregiver has given permission. Walking exercise is preferred.  Only take over-the-counter or prescription medicines for pain and discomfort as directed by your caregiver.  Do not drink alcohol while taking pain medicine.  Do not lift anything over 5 pounds.  Do not drive until your caregiver gives you permission.  Get plenty of rest and sleep.  Have someone help with your household chores for 1 to 2 weeks.  If you develop constipation, you may take a mild laxative with your caregiver's permission. Eating bran foods and drinking enough water and fluids to keep your  urine clear or pale yellow helps with constipation.  Do not take aspirin. It may cause bleeding.  You may resume normal diet and unstrenuous activities as directed.  Do not douche, use tampons, or engage in intercourse until your surgeon has given permission.  Change bandages (dressings) as directed.  Make and keep all your postoperative appointments. SEEK MEDICAL CARE IF:   You have abnormal vaginal discharge.  You develop a rash.  You are having a reaction to your medicine.  You develop nausea or vomiting. SEEK IMMEDIATE MEDICAL CARE IF:   You have redness, swelling, or increasing pain in the vaginal area.  You notice pus coming from the vagina.  You have a fever.  You notice a bad smell coming from the vagina.  You have increasing abdominal pain.  You have frequent urination or you notice burning during urination.  You notice blood in your urine.  You have excessive vaginal bleeding.  You cannot urinate. MAKE SURE YOU:   Understand these instructions.  Will watch your condition.  Will get help right away if you are not doing well or get worse. Document Released:  02/15/2000 Document Revised: 05/12/2011 Document Reviewed: 05/17/2009 [facility] Patient Information 2013 [place], [place].

## 2012-04-17 ENCOUNTER — Other Ambulatory Visit: Payer: Self-pay

## 2012-04-23 ENCOUNTER — Telehealth: Payer: Self-pay

## 2012-04-23 NOTE — Telephone Encounter (Signed)
Patient was in on 04/09/12 and discussed with Dr. Glenetta Hew that he recommended urodynamic testing. She has not heard from anyone to schedule.  I apologized to her that she has not heard anything and told her I will check ins benefits and forward info on to Sherrilyn Rist and have Sherrilyn Rist call her next week to schedule.

## 2012-05-04 ENCOUNTER — Telehealth: Payer: Self-pay | Admitting: *Deleted

## 2012-05-04 NOTE — Telephone Encounter (Signed)
Pt informed of $45 copay plus 20% coinsurance for urodynamcis. Apt for uro 3/10 at 845. KW

## 2012-05-10 ENCOUNTER — Ambulatory Visit (INDEPENDENT_AMBULATORY_CARE_PROVIDER_SITE_OTHER): Payer: Medicare Other | Admitting: *Deleted

## 2012-05-10 DIAGNOSIS — IMO0002 Reserved for concepts with insufficient information to code with codable children: Secondary | ICD-10-CM

## 2012-05-10 DIAGNOSIS — N8111 Cystocele, midline: Secondary | ICD-10-CM

## 2012-05-18 ENCOUNTER — Other Ambulatory Visit: Payer: Self-pay

## 2012-05-18 ENCOUNTER — Telehealth: Payer: Self-pay

## 2012-05-18 DIAGNOSIS — IMO0002 Reserved for concepts with insufficient information to code with codable children: Secondary | ICD-10-CM

## 2012-05-18 NOTE — Telephone Encounter (Signed)
Patient informed, per Dr. Glenetta Hew, that Dr. Glenetta Hew has spoken with Dr. Patsi Sears and he would like to see her before they plan surgery so that he can run some tests.  Dr. Glenetta Hew asked me to cancel the appt patient has here this week and she will wait for Dr. Melvia Heaps office to call her to schedule appt. I told her if they have not contacted her within a couple of days to let me know.

## 2012-05-19 ENCOUNTER — Ambulatory Visit: Payer: Medicare Other | Admitting: Gynecology

## 2012-05-25 ENCOUNTER — Other Ambulatory Visit: Payer: Self-pay | Admitting: Anesthesiology

## 2012-05-25 ENCOUNTER — Telehealth: Payer: Self-pay | Admitting: *Deleted

## 2012-05-25 DIAGNOSIS — Z1211 Encounter for screening for malignant neoplasm of colon: Secondary | ICD-10-CM

## 2012-05-25 NOTE — Telephone Encounter (Signed)
appt with Dr. Marcello Fennel on 06/16/12 @ 2;00 pm. Pt informed as well.

## 2012-06-24 ENCOUNTER — Other Ambulatory Visit: Payer: Self-pay

## 2012-06-24 MED ORDER — NYSTATIN-TRIAMCINOLONE 100000-0.1 UNIT/GM-% EX OINT: TOPICAL_OINTMENT | Freq: Two times a day (BID) | CUTANEOUS | Status: DC

## 2012-07-06 ENCOUNTER — Other Ambulatory Visit: Payer: Self-pay | Admitting: Urology

## 2012-07-09 ENCOUNTER — Encounter (HOSPITAL_COMMUNITY): Payer: Self-pay | Admitting: Pharmacy Technician

## 2012-07-19 ENCOUNTER — Encounter (HOSPITAL_COMMUNITY): Payer: Self-pay

## 2012-07-19 ENCOUNTER — Encounter (HOSPITAL_COMMUNITY)
Admission: RE | Admit: 2012-07-19 | Discharge: 2012-07-19 | Disposition: A | Discharge status: Home / Self Care | Payer: Medicare Other | Source: Ambulatory Visit | Attending: Urology | Admitting: Urology

## 2012-07-19 ENCOUNTER — Ambulatory Visit (HOSPITAL_COMMUNITY)
Admission: RE | Admit: 2012-07-19 | Discharge: 2012-07-19 | Disposition: A | Discharge status: Home / Self Care | Payer: Medicare Other | Source: Ambulatory Visit | Attending: Urology | Admitting: Urology

## 2012-07-19 DIAGNOSIS — Z01810 Encounter for preprocedural cardiovascular examination: Secondary | ICD-10-CM | POA: Insufficient documentation

## 2012-07-19 DIAGNOSIS — Z01812 Encounter for preprocedural laboratory examination: Secondary | ICD-10-CM | POA: Insufficient documentation

## 2012-07-19 DIAGNOSIS — I10 Essential (primary) hypertension: Secondary | ICD-10-CM | POA: Insufficient documentation

## 2012-07-19 DIAGNOSIS — Z01818 Encounter for other preprocedural examination: Secondary | ICD-10-CM | POA: Insufficient documentation

## 2012-07-19 DIAGNOSIS — T8859XA Other complications of anesthesia, initial encounter: Secondary | ICD-10-CM

## 2012-07-19 HISTORY — DX: Gastro-esophageal reflux disease without esophagitis: K21.9

## 2012-07-19 HISTORY — DX: Adverse effect of unspecified anesthetic, initial encounter: T41.45XA

## 2012-07-19 HISTORY — DX: Unspecified osteoarthritis, unspecified site: M19.90

## 2012-07-19 HISTORY — DX: Other complications of anesthesia, initial encounter: T88.59XA

## 2012-07-19 LAB — CBC
HCT: 41.3 % (ref 36.0–46.0)
MCV: 85.3 fL (ref 78.0–100.0)
RBC: 4.84 MIL/uL (ref 3.87–5.11)
WBC: 5.2 10*3/uL (ref 4.0–10.5)

## 2012-07-19 LAB — SURGICAL PCR SCREEN: MRSA, PCR: NEGATIVE

## 2012-07-19 LAB — BASIC METABOLIC PANEL
BUN: 11 mg/dL (ref 6–23)
CO2: 29 mEq/L (ref 19–32)
Chloride: 104 mEq/L (ref 96–112)
Creatinine, Ser: 0.74 mg/dL (ref 0.50–1.10)
GFR calc Af Amer: >90 mL/min (ref >90)
Potassium: 4.2 mEq/L (ref 3.5–5.1)

## 2012-07-19 NOTE — Patient Instructions (Addendum)
20 NITA WHITMIRE  07/19/2012   Your procedure is scheduled on:   07-23-2012  Report to [facility] at   0700     AM.  Call this number if you have problems the morning of surgery: [phone]  Or Presurgical Testing [phone](Latisha Lasch)   Remember: Follow any bowel prep instructions per MD office.    Do not eat food:After Midnight.   Take these medicines the morning of surgery with A SIP OF WATER: Pravastatin.   Do not wear jewelry, make-up or nail polish.  Do not wear lotions, powders, or perfumes. You may wear deodorant.  Do not shave 12 hours prior to first CHG shower(legs and under arms).(face and neck okay.)  Do not bring valuables to the hospital.  Contacts, dentures or bridgework,body piercing,  may not be worn into surgery.  Leave suitcase in the car. After surgery it may be brought to your room.  For patients admitted to the hospital, checkout time is 11:00 AM the day of discharge.   Patients discharged the day of surgery will not be allowed to drive home. Must have responsible person with you x 24 hours once discharged.  Name and phone number of your driver: Tye [place], daughter [phone] cell  Special Instructions: CHG(Chlorhedine 4%-"Hibiclens","Betasept","Aplicare") Shower Use Special Wash: see special instructions.(avoid face and genitals)   Please read over the following fact sheets that you were given: MRSA Information.    Failure to follow these instructions may result in Cancellation of your surgery.   Patient signature_______________________________________________________

## 2012-07-19 NOTE — Pre-Procedure Instructions (Signed)
07-19-12 EKG/ CXR Done today. Dr. Okey Dupre reviewed records of intubation concerns with 01-11-86 surgery with chart.

## 2012-07-23 ENCOUNTER — Encounter (HOSPITAL_COMMUNITY): Payer: Self-pay | Admitting: *Deleted

## 2012-07-23 ENCOUNTER — Encounter (HOSPITAL_COMMUNITY): Payer: Self-pay | Admitting: Anesthesiology

## 2012-07-23 ENCOUNTER — Observation Stay (HOSPITAL_COMMUNITY)
Admission: RE | Admit: 2012-07-23 | Discharge: 2012-07-24 | Disposition: A | Discharge status: Home / Self Care | Payer: Medicare Other | Source: Ambulatory Visit | Attending: Urology | Admitting: Urology

## 2012-07-23 ENCOUNTER — Ambulatory Visit (HOSPITAL_COMMUNITY): Payer: Medicare Other | Admitting: Anesthesiology

## 2012-07-23 ENCOUNTER — Encounter (HOSPITAL_COMMUNITY)
Admission: RE | Disposition: A | Discharge status: Home / Self Care | Payer: Self-pay | Source: Ambulatory Visit | Attending: Urology

## 2012-07-23 DIAGNOSIS — N3641 Hypermobility of urethra: Secondary | ICD-10-CM | POA: Insufficient documentation

## 2012-07-23 DIAGNOSIS — N3946 Mixed incontinence: Secondary | ICD-10-CM | POA: Insufficient documentation

## 2012-07-23 DIAGNOSIS — N816 Rectocele: Secondary | ICD-10-CM | POA: Insufficient documentation

## 2012-07-23 DIAGNOSIS — E119 Type 2 diabetes mellitus without complications: Secondary | ICD-10-CM | POA: Insufficient documentation

## 2012-07-23 DIAGNOSIS — E7800 Pure hypercholesterolemia, unspecified: Secondary | ICD-10-CM | POA: Insufficient documentation

## 2012-07-23 DIAGNOSIS — N8111 Cystocele, midline: Principal | ICD-10-CM | POA: Insufficient documentation

## 2012-07-23 DIAGNOSIS — N952 Postmenopausal atrophic vaginitis: Secondary | ICD-10-CM | POA: Insufficient documentation

## 2012-07-23 DIAGNOSIS — Z7982 Long term (current) use of aspirin: Secondary | ICD-10-CM | POA: Insufficient documentation

## 2012-07-23 DIAGNOSIS — K219 Gastro-esophageal reflux disease without esophagitis: Secondary | ICD-10-CM | POA: Insufficient documentation

## 2012-07-23 DIAGNOSIS — Z79899 Other long term (current) drug therapy: Secondary | ICD-10-CM | POA: Insufficient documentation

## 2012-07-23 DIAGNOSIS — N8110 Cystocele, unspecified: Secondary | ICD-10-CM | POA: Insufficient documentation

## 2012-07-23 HISTORY — PX: PUBOVAGINAL SLING: SHX1035

## 2012-07-23 HISTORY — PX: VAGINAL PROLAPSE REPAIR: SHX830

## 2012-07-23 LAB — GLUCOSE, CAPILLARY
Glucose-Capillary: 139 mg/dL — ABNORMAL HIGH (ref 70–99)
Glucose-Capillary: 134 mg/dL — ABNORMAL HIGH (ref 70–99)

## 2012-07-23 SURGERY — SUSPENSION, VAGINAL VAULT
Anesthesia: General | Wound class: Clean Contaminated

## 2012-07-23 MED ORDER — SODIUM CHLORIDE 0.9 % IR SOLN
Status: DC | PRN
  Administered 2012-07-23: 10:00:00

## 2012-07-23 MED ORDER — LACTATED RINGERS IV SOLN
INTRAVENOUS | Status: DC | PRN
  Administered 2012-07-23: 09:00:00 via INTRAVENOUS

## 2012-07-23 MED ORDER — MIRABEGRON ER 50 MG PO TB24
50.0000 mg | ORAL_TABLET | Freq: Every day | ORAL | Status: DC
  Administered 2012-07-23 – 2012-07-24 (×2): 50 mg via ORAL
  Filled 2012-07-23 (×2): qty 1

## 2012-07-23 MED ORDER — BUPIVACAINE-EPINEPHRINE 0.5% -1:200000 IJ SOLN
INTRAMUSCULAR | Status: DC | PRN
  Administered 2012-07-23: 30 mL

## 2012-07-23 MED ORDER — HYDROMORPHONE HCL PF 1 MG/ML IJ SOLN: 0.5000 mg | INTRAMUSCULAR | Status: DC | PRN

## 2012-07-23 MED ORDER — ACETAMINOPHEN 10 MG/ML IV SOLN: 1000.0000 mg | Freq: Once | INTRAVENOUS | Status: DC | PRN

## 2012-07-23 MED ORDER — METFORMIN HCL ER 500 MG PO TB24
500.0000 mg | ORAL_TABLET | Freq: Two times a day (BID) | ORAL | Status: DC
  Administered 2012-07-23 – 2012-07-24 (×2): 500 mg via ORAL
  Filled 2012-07-23 (×4): qty 1

## 2012-07-23 MED ORDER — ALUM & MAG HYDROXIDE-SIMETH 200-200-20 MG/5ML PO SUSP
30.0000 mL | Freq: Once | ORAL | Status: AC
  Administered 2012-07-23: 30 mL via ORAL
  Filled 2012-07-23: qty 30

## 2012-07-23 MED ORDER — ESTRADIOL 0.1 MG/GM VA CREA
TOPICAL_CREAM | VAGINAL | Status: DC | PRN
  Administered 2012-07-23: 1 via VAGINAL

## 2012-07-23 MED ORDER — POLYETHYLENE GLYCOL 3350 17 G PO PACK
17.0000 g | PACK | Freq: Every day | ORAL | Status: DC
  Administered 2012-07-23 – 2012-07-24 (×2): 17 g via ORAL
  Filled 2012-07-23 (×2): qty 1

## 2012-07-23 MED ORDER — OXYCODONE-ACETAMINOPHEN 5-325 MG PO TABS: 1.0000 | ORAL_TABLET | Freq: Four times a day (QID) | ORAL | Status: DC | PRN

## 2012-07-23 MED ORDER — ONDANSETRON HCL 4 MG/2ML IJ SOLN: 4.0000 mg | INTRAMUSCULAR | Status: DC | PRN

## 2012-07-23 MED ORDER — METFORMIN HCL ER 500 MG PO TB24: 500.0000 mg | ORAL_TABLET | Freq: Two times a day (BID) | ORAL | Status: DC

## 2012-07-23 MED ORDER — FENTANYL CITRATE 0.05 MG/ML IJ SOLN
INTRAMUSCULAR | Status: DC | PRN
  Administered 2012-07-23: 50 ug via INTRAVENOUS
  Administered 2012-07-23: 25 ug via INTRAVENOUS
  Administered 2012-07-23: 50 ug via INTRAVENOUS
  Administered 2012-07-23: 25 ug via INTRAVENOUS

## 2012-07-23 MED ORDER — INDIGOTINDISULFONATE SODIUM 8 MG/ML IJ SOLN
INTRAMUSCULAR | Status: AC
  Filled 2012-07-23: qty 5

## 2012-07-23 MED ORDER — ACETAMINOPHEN 10 MG/ML IV SOLN
INTRAVENOUS | Status: AC
  Filled 2012-07-23: qty 100

## 2012-07-23 MED ORDER — SODIUM CHLORIDE 0.45 % IV SOLN
INTRAVENOUS | Status: DC
  Administered 2012-07-23: 50 mL/h via INTRAVENOUS
  Administered 2012-07-24: 06:00:00 via INTRAVENOUS

## 2012-07-23 MED ORDER — MIRTAZAPINE 15 MG PO TABS
15.0000 mg | ORAL_TABLET | Freq: Every day | ORAL | Status: DC | PRN
  Filled 2012-07-23: qty 1

## 2012-07-23 MED ORDER — ACETAMINOPHEN 10 MG/ML IV SOLN
INTRAVENOUS | Status: DC | PRN
  Administered 2012-07-23: 1000 mg via INTRAVENOUS

## 2012-07-23 MED ORDER — PROMETHAZINE HCL 25 MG/ML IJ SOLN: 6.2500 mg | INTRAMUSCULAR | Status: DC | PRN

## 2012-07-23 MED ORDER — ZOLPIDEM TARTRATE 5 MG PO TABS: 5.0000 mg | ORAL_TABLET | Freq: Every evening | ORAL | Status: DC | PRN

## 2012-07-23 MED ORDER — CEFAZOLIN SODIUM-DEXTROSE 2-3 GM-% IV SOLR
2.0000 g | INTRAVENOUS | Status: AC
Start: 2012-07-23 — End: 2012-07-23
  Administered 2012-07-23: 2 g via INTRAVENOUS

## 2012-07-23 MED ORDER — HYDROMORPHONE HCL PF 1 MG/ML IJ SOLN: 0.2500 mg | INTRAMUSCULAR | Status: DC | PRN

## 2012-07-23 MED ORDER — SIMVASTATIN 5 MG PO TABS
5.0000 mg | ORAL_TABLET | Freq: Every day | ORAL | Status: DC
  Administered 2012-07-23: 5 mg via ORAL
  Filled 2012-07-23 (×2): qty 1

## 2012-07-23 MED ORDER — BUPIVACAINE-EPINEPHRINE (PF) 0.5% -1:200000 IJ SOLN
INTRAMUSCULAR | Status: AC
  Filled 2012-07-23: qty 10

## 2012-07-23 MED ORDER — BUPIVACAINE-EPINEPHRINE PF 0.25-1:200000 % IJ SOLN
INTRAMUSCULAR | Status: AC
  Filled 2012-07-23: qty 30

## 2012-07-23 MED ORDER — OXYCODONE-ACETAMINOPHEN 5-325 MG PO TABS: 1.0000 | ORAL_TABLET | ORAL | Status: DC | PRN

## 2012-07-23 MED ORDER — PROPOFOL 10 MG/ML IV BOLUS
INTRAVENOUS | Status: DC | PRN
  Administered 2012-07-23: 150 mg via INTRAVENOUS

## 2012-07-23 MED ORDER — CLOTRIMAZOLE 1 % EX CREA
TOPICAL_CREAM | Freq: Two times a day (BID) | CUTANEOUS | Status: DC
  Administered 2012-07-23 – 2012-07-24 (×3): via TOPICAL
  Filled 2012-07-23: qty 15

## 2012-07-23 MED ORDER — KETOROLAC TROMETHAMINE 30 MG/ML IJ SOLN
INTRAMUSCULAR | Status: DC | PRN
  Administered 2012-07-23: 30 mg via INTRAVENOUS

## 2012-07-23 MED ORDER — ACETAMINOPHEN 10 MG/ML IV SOLN
1000.0000 mg | Freq: Four times a day (QID) | INTRAVENOUS | Status: AC
  Administered 2012-07-23 – 2012-07-24 (×4): 1000 mg via INTRAVENOUS
  Filled 2012-07-23 (×5): qty 100

## 2012-07-23 MED ORDER — LOSARTAN POTASSIUM 50 MG PO TABS
50.0000 mg | ORAL_TABLET | Freq: Every morning | ORAL | Status: DC
  Administered 2012-07-23 – 2012-07-24 (×2): 50 mg via ORAL
  Filled 2012-07-23 (×2): qty 1

## 2012-07-23 MED ORDER — CIPROFLOXACIN HCL 500 MG PO TABS
500.0000 mg | ORAL_TABLET | Freq: Two times a day (BID) | ORAL | Status: DC
  Administered 2012-07-23 – 2012-07-24 (×3): 500 mg via ORAL
  Filled 2012-07-23 (×5): qty 1

## 2012-07-23 MED ORDER — CIPROFLOXACIN HCL 500 MG PO TABS: 500.0000 mg | ORAL_TABLET | Freq: Two times a day (BID) | ORAL | Status: DC

## 2012-07-23 MED ORDER — DIPHENHYDRAMINE HCL 12.5 MG/5ML PO ELIX: 12.5000 mg | ORAL_SOLUTION | Freq: Four times a day (QID) | ORAL | Status: DC | PRN

## 2012-07-23 MED ORDER — ESTRADIOL 0.1 MG/GM VA CREA
TOPICAL_CREAM | VAGINAL | Status: AC
  Filled 2012-07-23: qty 42.5

## 2012-07-23 MED ORDER — DIPHENHYDRAMINE HCL 50 MG/ML IJ SOLN: 12.5000 mg | Freq: Four times a day (QID) | INTRAMUSCULAR | Status: DC | PRN

## 2012-07-23 MED ORDER — BELLADONNA ALKALOIDS-OPIUM 16.2-60 MG RE SUPP
RECTAL | Status: AC
  Filled 2012-07-23: qty 1

## 2012-07-23 MED ORDER — MEPERIDINE HCL 50 MG/ML IJ SOLN: 6.2500 mg | INTRAMUSCULAR | Status: DC | PRN

## 2012-07-23 MED ORDER — ONDANSETRON HCL 4 MG/2ML IJ SOLN
INTRAMUSCULAR | Status: DC | PRN
  Administered 2012-07-23: 4 mg via INTRAVENOUS

## 2012-07-23 MED ORDER — PHENYLEPHRINE HCL 10 MG/ML IJ SOLN
INTRAMUSCULAR | Status: DC | PRN
  Administered 2012-07-23 (×3): 40 ug via INTRAVENOUS

## 2012-07-23 MED ORDER — STERILE WATER FOR IRRIGATION IR SOLN
Status: DC | PRN
  Administered 2012-07-23: 3000 mL

## 2012-07-23 MED ORDER — SENNA 8.6 MG PO TABS
1.0000 | ORAL_TABLET | Freq: Two times a day (BID) | ORAL | Status: DC
  Administered 2012-07-23 – 2012-07-24 (×3): 8.6 mg via ORAL
  Filled 2012-07-23 (×3): qty 1

## 2012-07-23 MED ORDER — KETOROLAC TROMETHAMINE 30 MG/ML IJ SOLN
30.0000 mg | Freq: Once | INTRAMUSCULAR | Status: AC
  Administered 2012-07-23: 30 mg via INTRAVENOUS
  Filled 2012-07-23: qty 1

## 2012-07-23 MED ORDER — CEFAZOLIN SODIUM-DEXTROSE 2-3 GM-% IV SOLR
INTRAVENOUS | Status: AC
  Filled 2012-07-23: qty 50

## 2012-07-23 MED ORDER — LIDOCAINE HCL (CARDIAC) 20 MG/ML IV SOLN
INTRAVENOUS | Status: DC | PRN
  Administered 2012-07-23: 50 mg via INTRAVENOUS

## 2012-07-23 MED ORDER — INDIGOTINDISULFONATE SODIUM 8 MG/ML IJ SOLN
INTRAMUSCULAR | Status: DC | PRN
  Administered 2012-07-23: 5 mL via INTRAVENOUS

## 2012-07-23 SURGICAL SUPPLY — 48 items
ADH SKN CLS APL DERMABOND .7 (GAUZE/BANDAGES/DRESSINGS)
BAG DECANTER FOR FLEXI CONT (MISCELLANEOUS) ×2 IMPLANT
BAG URINE DRAINAGE (UROLOGICAL SUPPLIES) ×1 IMPLANT
BLADE HEX COATED 2.75 (ELECTRODE) ×1 IMPLANT
BLADE SURG 15 STRL LF DISP TIS (BLADE) ×1 IMPLANT
BLADE SURG 15 STRL SS (BLADE) ×2
BLADE SURG SZ10 CARB STEEL (BLADE) ×1 IMPLANT
CATH FOLEY 2WAY SLVR  5CC 18FR (CATHETERS) ×1
CATH FOLEY 2WAY SLVR 5CC 18FR (CATHETERS) ×1 IMPLANT
CLOTH BEACON ORANGE TIMEOUT ST (SAFETY) ×2 IMPLANT
COVER SURGICAL LIGHT HANDLE (MISCELLANEOUS) ×2 IMPLANT
DECANTER SPIKE VIAL GLASS SM (MISCELLANEOUS) ×2 IMPLANT
DERMABOND ADVANCED (GAUZE/BANDAGES/DRESSINGS)
DERMABOND ADVANCED .7 DNX12 (GAUZE/BANDAGES/DRESSINGS) IMPLANT
DRAIN PENROSE 18X1/2 LTX STRL (DRAIN) ×2 IMPLANT
DRAPE CAMERA CLOSED 9X96 (DRAPES) ×2 IMPLANT
DRAPE UTILITY 15X26 (DRAPE) ×2 IMPLANT
ELECT REM PT RETURN 9FT ADLT (ELECTROSURGICAL) ×2
ELECTRODE REM PT RTRN 9FT ADLT (ELECTROSURGICAL) IMPLANT
GLOVE BIOGEL M STRL SZ7.5 (GLOVE) ×6 IMPLANT
GOWN STRL REIN XL XLG (GOWN DISPOSABLE) ×2 IMPLANT
KIT BASIN OR (CUSTOM PROCEDURE TRAY) ×2 IMPLANT
KIT SUPRAPUBIC CATH (MISCELLANEOUS) IMPLANT
NEEDLE HYPO 22GX1.5 SAFETY (NEEDLE) ×2 IMPLANT
NS IRRIG 1000ML POUR BTL (IV SOLUTION) IMPLANT
PACK CYSTO (CUSTOM PROCEDURE TRAY) ×2 IMPLANT
PACKING VAGINAL (PACKING) ×2 IMPLANT
PAK SCROTO (SET/KITS/TRAYS/PACK) ×1 IMPLANT
PENCIL BUTTON HOLSTER BLD 10FT (ELECTRODE) ×1 IMPLANT
PLUG CATH AND CAP STER (CATHETERS) ×2 IMPLANT
RETRACTOR LONRSTAR 16.6X16.6CM (MISCELLANEOUS) ×1 IMPLANT
RETRACTOR STAY HOOK 5MM (MISCELLANEOUS) ×2 IMPLANT
RETRACTOR STER APS 16.6X16.6CM (MISCELLANEOUS)
SCRUB PCMX 4 OZ (MISCELLANEOUS) ×2 IMPLANT
SET CYSTO W/LG BORE CLAMP LF (SET/KITS/TRAYS/PACK) ×1 IMPLANT
SHEET LAVH (DRAPES) ×2 IMPLANT
SLING SOLYX SYSTEM SIS EA (Sling) ×1 IMPLANT
SPONGE LAP 4X18 X RAY DECT (DISPOSABLE) ×6 IMPLANT
SUCTION FRAZIER 12FR DISP (SUCTIONS) ×2 IMPLANT
SUT VIC AB 2-0 UR5 27 (SUTURE) ×2 IMPLANT
SUT VIC AB 2-0 UR6 27 (SUTURE) ×14 IMPLANT
SUT VICRYL 3 0 UR 6 27 (SUTURE) IMPLANT
SYRINGE 10CC LL (SYRINGE) ×2 IMPLANT
SYSTEM VAGINAL SUPPORT UPHOLD (Sling) ×1 IMPLANT
TOWEL OR NON WOVEN STRL DISP B (DISPOSABLE) ×2 IMPLANT
TUBING CONNECTING 10 (TUBING) ×2 IMPLANT
WATER STERILE IRR 1500ML POUR (IV SOLUTION) IMPLANT
YANKAUER SUCT BULB TIP 10FT TU (MISCELLANEOUS) ×2 IMPLANT

## 2012-07-23 NOTE — Interval H&P Note (Signed)
History and Physical Interval Note:  07/23/2012 9:36 AM  Melissa Glenn  has presented today for surgery, with the diagnosis of ANTERIOR VAULT PROLAPSE INCONTINENCE   The various methods of treatment have been discussed with the patient and family. After consideration of risks, benefits and other options for treatment, the patient has consented to  Procedure(s): UP HOLD LITE ANTERIOR VAULT SUSPENSION AND SOLYX SLING  (N/A) PUBO-VAGINAL SLING (N/A) as a surgical intervention .  The patient's history has been reviewed, patient examined, no change in status, stable for surgery.  I have reviewed the patient's chart and labs.  Questions were answered to the patient's satisfaction.     [name] I

## 2012-07-23 NOTE — Transfer of Care (Signed)
Immediate Anesthesia Transfer of Care Note  Patient: Melissa Glenn  Procedure(s) Performed: Procedure(s): UP HOLD LITE ANTERIOR VAULT SUSPENSION (N/A) Pubo-Vaginal Solyx Sling (N/A)  Patient Location: PACU  Anesthesia Type:General  Level of Consciousness: awake and sedated  Airway & Oxygen Therapy: Patient Spontanous Breathing  Post-op Assessment: Report given to PACU RN and Post -op Vital signs reviewed and stable  Post vital signs: Reviewed and stable  Complications: No apparent anesthesia complications

## 2012-07-23 NOTE — Op Note (Signed)
Pre-operative diagnosis :   Urinary incontinence with stage III anterior vault prolapse  Postoperative diagnosis:  Same  Operation:  [employer] uphold light anterior vault sacrospinous mesh suspension with Kelly plication; Solyx. mid urethral sling  Surgeon:  S. Patsi Sears, MD  First assistant:  None  Anesthesia:  General LMA  Preparation:  After appropriate preanesthesia, the patient was brought to the operating room, placed on the operating table in the dorsal supine position where general LMA anesthesia was introduced. She was then replaced in the dorsal lithotomy position with pubis was prepped with Betadine solution and draped in usual fashion. The armband was double checked. The history was double checked.  Review history:   Active Problems  Problems  1. Cystocele 596.89  2. Rectocele 596.89  3. Urethral Hypermobility 599.81  4. Urge And Stress Incontinence 788.33  History of Present Illness  Melissa Glenn is a 79 year old female referred by Dr. Lily Peer for further evaluation of mixed urinary incontince. She has noted an anterior vaginal vault prolapse for 2 years, with pressure, requiring digitation in order to void. She denies stress urinary incontinence. She has discussed a possible hysterectomy with pelvic organ prolapse repair with Dr. Lily Peer. She is not currently sexually active. She is para 5-6-0. No history of tobacco use. There is no family history of pelvic organ prolapse. She notes occasional cough laughing and sneezing continence, and also urge incontinence. This is untreated. She drink water and hot tea. No caffeine sodas. She is a type II diabetic. No glaucoma.  On physical examination, the patient was noted to have atrophic vaginitis, tenderness on the vaginal examination secondary to poor estrogenization. There was no discharge, however. No rectocele, and no uterine prolapse was noted. There was a cystocele present, with a midline defect grade 3/4. No enterocele  noted. The cervix and uterus appeared normal. The peak flow rate was 2 cc/sec. Ultrasound showed a postvoid residual of 88 cc. Marshal test showed a hypermobile urethra, but negative Marshall test for leakage.  She was felt to have symptomatic pelvic floor prolapse, grade 3, stage II, with a hypermobile urethra. She had negative Gaynell Face test, but of concern is her peak flow rate of 2 cc per second, and 88 cc postvoid residual, possibly indicating a weakened detrusor contraction. She is now for video urodynamics for evaluation.  Video urodynamics is accomplished on 06/22/12, in the sitting position, and shows a first sensation at 161 cc, normal desire at 189 cc, strong desire at 194 cc. The bladder becomes unstable, with a maximum bladder contraction pressure of 18 cm water. Her severe urinary leakage at 491 cc. Leak point pressure determination as accomplished, but noted the patient did not leak with a maximum abdominal degeneration of 135 cm water pressure. No leakage occurred with or without reduction of prolapse. This was noted with both visual inspection, along with x-ray inspection.  Pressure flow study showed a maximum flow rate of 16 cc per second, with detrusor pressure at maximum flow of 3 cm water pressure. Maximum detrusor pressure was 26 cm water. PVR is 200 cc.  The patient has severe urinary urgency with Valsalva maneuver. Bladder is prolapsed into the vaginal vault. Urgency subsides after inserting vaginal packing. There is no stress incontinence noted. There was however Valsalva induced instability and leakage. The patient had difficulty generating a voluntary contraction, and did not feel in control of her voiding.      Statement of  Likelihood of Success: Excellent. TIME-OUT observed.:  Procedure:  Examination the operating room  revealed stage III anterior vault prolapse with hypermobile urethra. Using a marking pen, outline of the mid urethra was accomplished, and with Foley catheter  placed, the bladder neck was identified. A horizontal marking incision line was made 1-2 cm proximal to the bladder neck. 60 cc of Marcaine 25 with epinephrine 1 200,000 was injected she is as Hydro dissection, as well as to afford postoperative analgesia. The large cystocele was dissected from the anterior vaginal wall. Dissection was carried into the pelvic floor, where the ischial spines were identified bilaterally. The sacrospinous ligaments were also identified bilaterally.  A small Kelly plication was accomplished using 2-0 Vicryl running suture. Following this, using the U. device, the uphold light sutures were placed through the sacrospinous ligaments, one finger breath medial to the ischial spine bilaterally. The uphold light mesh was then brought into the wound, and sutured in place with 3 separate 2-0 Vicryl sutures around the bladder neck, and 3 separate 2-0 Vicryl sutures distalward. The mesh was then maneuvered in place, and each wing cut and removed in standard fashion. The large excessive amount of anterior vaginal wall was reduced, and the anterior vaginal wall was closed with running 2-0 Vicryl suture. Indigocarmine was given, and cystoscopy was accomplished with both the 30 and the 70 lenses. Cystoscopy revealed blue jets of urine bilaterally from the ureteral orifices, located in normal position on the trigone.  Foley catheter was replaced, posterior is speculum was replaced, and the [employer] retractor was replaced. Outlined for mid-urethral sling was accomplished, and 10 cc of Marcaine 25 with epinephrine 1 200,000 was injected over the mid urethra. Using a 15 blade, a 2 cm incision was made in the midline, subcutaneous tissue dissected bilaterally. The Solyx. sling was then placed bilaterally into the operator introduced muscle under the pubic tubercle. On the right side, the sling was placed past the midline marked, and on the left side, was placed until the sling was snug against the  urethra. Pillowing effect was noted on direct vision. The mesh was smooth against the urethra. The sling was released, and the wound closed with running 2-0 Vicryl suture.  Vaginal irrigation was accomplished, and vaginal packing was accomplished with Estrace cream. The patient received IV Tylenol at the beginning of the procedure, and IV Toradol at the end of the procedure. She also received a B. and O. suppository at the beginning of the procedure. She was awakened and taken to recovery room in good condition.

## 2012-07-23 NOTE — Anesthesia Preprocedure Evaluation (Addendum)
Anesthesia Evaluation  Patient identified by MRN, date of birth, ID band Patient awake    Reviewed: Allergy & Precautions, H&P , NPO status , Patient's Chart, lab work & pertinent test results  Airway Mallampati: II TM Distance: >3 FB Neck ROM: Full    Dental  (+) Dental Advisory Given, Missing, Implants and Caps   Pulmonary neg pulmonary ROS,  breath sounds clear to auscultation        Cardiovascular hypertension, Pt. on medications + Peripheral Vascular Disease negative cardio ROS  Rhythm:Regular Rate:Normal     Neuro/Psych negative neurological ROS  negative psych ROS   GI/Hepatic negative GI ROS, Neg liver ROS, GERD-  ,  Endo/Other  diabetes, Type 2, Oral Hypoglycemic Agents  Renal/GU negative Renal ROS     Musculoskeletal negative musculoskeletal ROS (+)   Abdominal (+) + obese,   Peds  Hematology negative hematology ROS (+)   Anesthesia Other Findings   Reproductive/Obstetrics negative OB ROS                          Anesthesia Physical Anesthesia Plan  ASA: III  Anesthesia Plan: General   Post-op Pain Management:    Induction: Intravenous  Airway Management Planned: LMA  Additional Equipment:   Intra-op Plan:   Post-operative Plan: Extubation in OR  Informed Consent: I have reviewed the patients History and Physical, chart, labs and discussed the procedure including the risks, benefits and alternatives for the proposed anesthesia with the patient or authorized representative who has indicated his/her understanding and acceptance.   Dental advisory given  Plan Discussed with: CRNA  Anesthesia Plan Comments:         Anesthesia Quick Evaluation

## 2012-07-23 NOTE — Anesthesia Postprocedure Evaluation (Signed)
Anesthesia Post Note  Patient: Melissa Glenn  Procedure(s) Performed: Procedure(s) (LRB): UP HOLD LITE ANTERIOR VAULT SUSPENSION (N/A) Pubo-Vaginal Solyx Sling (N/A)  Anesthesia type: General  Patient location: PACU  Post pain: Pain level controlled  Post assessment: Post-op Vital signs reviewed  Last Vitals: BP 113/69  Pulse 76  Temp(Src) 36.3 C (Oral)  Resp 18  Ht 5\' 3"  (1.6 m)  Wt 174 lb 13.2 oz (79.3 kg)  BMI 30.98 kg/m2  SpO2 100%  Post vital signs: Reviewed  Level of consciousness: sedated  Complications: No apparent anesthesia complications

## 2012-07-23 NOTE — H&P (Signed)
ief Complaint  cc: Dr. Lily Peer   Reason For Visit  F/u to review urodynamic results   Active Problems Problems  1. Cystocele 596.89 2. Rectocele 596.89 3. 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 Urge And Stress Incontinence 788.33  History of Present Illness       Trystin is a 79 year old female referred by Dr. Lily Peer for further evaluation of mixed urinary incontince. She has noted an anterior vaginal vault prolapse for 2 years, with pressure, requiring digitation in order to void. She denies stress urinary incontinence. She has discussed a possible hysterectomy with pelvic organ prolapse repair with Dr. Lily Peer. She is not currently sexually active. She is para 5-6-0. No history of tobacco use. There is no family history of pelvic organ prolapse. She notes occasional cough laughing and sneezing continence, and also urge incontinence. This is untreated. She drink water and hot tea. No caffeine sodas. She is a type II diabetic. No glaucoma.  On physical examination, the patient was noted to have atrophic vaginitis, tenderness on the vaginal examination secondary to poor estrogenization. There was no discharge, however. No rectocele, and no uterine prolapse was noted. There was a cystocele present, with a midline defect grade 3/4. No enterocele noted. The cervix and uterus appeared normal. The peak flow rate was 2 cc/sec. Ultrasound showed a postvoid residual of 88 cc. Marshal test showed a hypermobile urethra, but negative Marshall test for leakage.     She was felt to have symptomatic pelvic floor prolapse, grade 3, stage II, with a hypermobile urethra. She had negative Gaynell Face test, but of concern is her peak flow rate of 2 cc per second, and 88 cc postvoid residual, possibly indicating a weakened detrusor contraction. She is now for video urodynamics for evaluation.  Video urodynamics is accomplished on 06/22/12, in the sitting position, and shows a first sensation at 161 cc, normal desire at  189 cc, strong desire at 194 cc. The bladder becomes unstable, with a maximum bladder contraction pressure of 18 cm water. Her severe urinary leakage at 491 cc. Leak point pressure determination as accomplished, but noted the patient did not leak with a maximum abdominal degeneration of 135 cm water pressure. No leakage occurred with or without reduction of prolapse. This was noted with both visual inspection, along with x-ray inspection.  Pressure flow study showed a maximum flow rate of 16 cc per second, with detrusor pressure at maximum flow of 3 cm water pressure. Maximum detrusor pressure was 26 cm water. PVR is 200 cc.  The patient has severe urinary urgency with Valsalva maneuver. Bladder is prolapsed into the vaginal vault. Urgency subsides after inserting vaginal packing. There is no stress incontinence noted. There was however Valsalva induced instability and leakage. The patient had difficulty generating a voluntary contraction, and did not feel in control of her voiding.   Past Medical History Problems  1. History of  Diabetes Mellitus 250.00 2. History of  Esophageal Reflux 530.81 3. History of  Hypercholesterolemia 272.0  Surgical History Problems  1. History of  Adenoidectomy 2. History of  Cataract Surgery 3. History of  Cholecystectomy 4. History of  Dilation And Curettage 5. History of  Knee Arthroscopy 6. History of  Laryngoscopy (Diagnostic) 7. History of  Tonsillectomy 8. History of  Tubal Ligation V25.2  Current Meds 1. Aspirin 81 MG Oral Tablet; Therapy: (Recorded:31Mar2014) to 2. Cozaar 50 MG Oral Tablet; Therapy: (Recorded:31Mar2014) to 3. Estradiol Cream 0.02%; Therapy: (Recorded:31Mar2014) to 4. MetFORMIN HCl 500 MG Oral Tablet; Therapy: (Recorded:31Mar2014) to  5. Nystatin-Triamcinolone 100000-0.1 UNIT/GM-% External Cream; Therapy:  (Recorded:31Mar2014) to 6. Pravachol 40 MG Oral Tablet; Therapy: (Recorded:31Mar2014) to 7. Restoril 15 MG Oral Capsule;  Therapy: (Recorded:31Mar2014) to 8. Vitamin D3 1000 UNIT Oral Capsule; Therapy: (Recorded:31Mar2014) to  Allergies Medication  1. Lovastatin TABS 2. Sulfa Drugs  Family History Problems  1. Family history of  Family Health Status Number Of Children 3 sons, 2 daughters 2. Family history of  Mother Deceased At Age ____ 3. Maternal history of  Stroke Syndrome V17.1  Social History Problems  1. Alcohol Use 2. Caffeine Use 4-5 per day 3. Marital History - Single 4. Never A Smoker 5. Occupation: Retired Investment banker, corporate, constitutional, skin, eye, otolaryngeal, hematologic/lymphatic, cardiovascular, pulmonary, endocrine, musculoskeletal, gastrointestinal, neurological and psychiatric system(s) were reviewed and pertinent findings if present are noted.  Genitourinary: nocturia and incontinence.  Integumentary: skin rash/lesion and pruritus.    Vitals Vital Signs [Data Includes: Last 1 Day]  02May2014 02:58PM  Blood Pressure: 135 / 68 Temperature: 97.8 F Heart Rate: 84  Physical Exam Genitourinary:  Chaperone Present: kim.  Examination of the external genitalia shows vulvar atrophy, but normal female external genitalia. The urethra is normal in appearance, not tender, does not appear stenotic and no urethral caruncle. There is no urethral mass. Urethral hypermobility is present. There is no urethral discharge. There is no urethral prolapse. Vaginal exam demonstrates atrophy, tenderness and the vaginal epithelium to be poorly estrogenized, but no discharge, no uterine prolapse and no mass. A cystocele is present with a midline defect (grade 3 /4). No enterocele is identified. No rectocele is identified. The cervix is is without abnormalities. The uterus is without abnormalities. The adnexa are palpably normal. The bladder is non tender. The anus is normal on inspection.    Assessment Assessed  1. Urge And Stress Incontinence 788.33 2. Urethral Hypermobility  599.81 3. Cystocele 596.89 4. Rectocele 596.89   Amma is a 79 yo female, Grade 3, Stage III POP, and hypermobile urethra. She has urge incontinence triggered by valsalva, which will need anticholinergic medication. We have discussed approaches to her anatomic problems, and have conseidered, all options, including possibility of  hysterectomy, anterior sacrospinus vault suspension, and Soyx sling. We have discussed repair types, including native tissue repair, biologic tissue augmentation repair, and mesh augmentation repair. We have discussed alternatives to her treatment, and her personal experience in the operating room.   Plan     Consider BS trial: UPHOLD Lite. Will also need Solyx. She does not want hysterectomy unless it would be necessary for cancer, etc.  Research RN to discuss today .  Begin Myrbetriq 50mg /day.   Discussion/Summary         Signatures Electronically signed by : [name], M.D.; Jul 02 2012  4:14PM

## 2012-07-23 NOTE — Interval H&P Note (Signed)
History and Physical Interval Note:  07/23/2012 9:37 AM  Melissa Glenn  has presented today for surgery, with the diagnosis of ANTERIOR VAULT PROLAPSE INCONTINENCE   The various methods of treatment have been discussed with the patient and family. After consideration of risks, benefits and other options for treatment, the patient has consented to  Procedure(s): UP HOLD LITE ANTERIOR VAULT SUSPENSION AND SOLYX SLING  (N/A) PUBO-VAGINAL SLING (N/A) as a surgical intervention .  The patient's history has been reviewed, patient examined, no change in status, stable for surgery.  I have reviewed the patient's chart and labs.  Questions were answered to the patient's satisfaction.     [name] I

## 2012-07-24 NOTE — Progress Notes (Signed)
Patient ID: Melissa Glenn, female   DOB: 22-Oct-1930, 79 y.o.   MRN: [id number]  1 Day Post-Op Subjective: The patient is doing well.  No nausea or vomiting. Pain is adequately controlled. She did void once which was fairly bloody.  Objective: Vital signs in last 24 hours: Temp:  [97.4 F (36.3 C)-98.2 F (36.8 C)] 98.2 F (36.8 C) (05/24 0609) Pulse Rate:  [76-87] 86 (05/24 0609) Resp:  [12-21] 16 (05/24 0609) BP: (102-126)/(41-69) 102/47 mmHg (05/24 0609) SpO2:  [93 %-100 %] 93 % (05/24 0609) Weight:  [79.3 kg (174 lb 13.2 oz)] 79.3 kg (174 lb 13.2 oz) (05/23 1305)  Intake/Output from previous day: 05/23 0701 - 05/24 0700 In: 3920 [P.O.:720; I.V.:2900; IV Piggyback:300] Out: 2435 [Urine:2435] Intake/Output this shift: Total I/O In: -  Out: 100 [Urine:100]  Physical Exam:  General: Alert and oriented. GI: Soft, Nondistended.     Assessment/Plan: - Packing and catheter were removed.  Her first void was fairly bloody although it is unclear if this is vaginal blood as opposed to true hematuria.  Will monitor her for a few more voids to ensure no hematuria and will check PVRs.  Likely D/C later today.  Rolly Salter, Montez Hageman. MD   LOS: 1 day   Emaly Boschert,LES 07/24/2012, 8:20 AM

## 2012-07-24 NOTE — Progress Notes (Signed)
Pt's vaginal packing and foley catheter discontinued per ordered this am.  Will continue to monitor. Newman Nip Eldora

## 2012-07-24 NOTE — Progress Notes (Signed)
Patient voided 250cc prior to d/c home. Foley Catheter placed drained another 600cc clear yellow urine.Patient no complaints of any pain or discomfort prior to d/c home.

## 2012-07-24 NOTE — Progress Notes (Signed)
Voided 2x very light pink to yellow urine. PVR 178cc.

## 2012-07-24 NOTE — Progress Notes (Signed)
Patient ID: Melissa Glenn, female   DOB: [birth year], 79 y.o.   MRN: [id number]  Patient's urine is clearing.  PVRs remain over 200cc.  She will be instructed to perform CIC at home after voiding until PVR is less than 150cc.  If unable or unwilling to learn CIC, she will be discharged with a catheter with plans to follow up for a voiding trial next week.

## 2012-07-24 NOTE — Progress Notes (Signed)
Voided 2x light clear yellow urine (see I&O flowsheet) PVR 259. Md made aware. Patient was given two  options prior to discharge home.And  prefers to go home with indwelling catheter (option 2), verbalized I don't want to have infection by doing self cath ( option 1). Was explain that she will only do self cath after voiding and if PVR is <150 she can stop. Patient discharge to home, daughter at bedside. D/c instructions and follow up appointments done and was given to the patient. Patient discharge home with indwelling catheter and was told to call Dr. Imelda Pillow office for follow up. PIV removed no s/s of infection or swelling in insertion site.

## 2012-07-27 ENCOUNTER — Encounter (HOSPITAL_COMMUNITY): Payer: Self-pay | Admitting: Urology

## 2012-07-29 NOTE — Discharge Summary (Signed)
  Physician Discharge Summary  Patient ID: Melissa Glenn MRN: [id number] DOB/AGE: 1931-01-14 79 y.o.  Admit date: 07/23/2012 Discharge date: 07/29/2012  Admission Diagnoses: anterior vault prolapse incontienENCE  Discharge Diagnoses:  same  Discharged Condition:improved  Hospital Course:  Surgery and 23 hr observation  Consults: none  Significant Diagnostic Studies:   Treatments:  Surgery, Anterior Vault Suspension, Solyx Sling  Discharge Exam: Blood pressure 102/47, pulse 86, temperature 98.2 F (36.8 C), temperature source Oral, resp. rate 16, height 5\' 3"  (1.6 m), weight 79.3 kg (174 lb 13.2 oz), SpO2 93.00%. Pelvic: cervix normal in appearance, no adnexal masses or tenderness, no cervical motion tenderness, rectovaginal septum normal, uterus normal size, shape, and consistency and vagina normal without discharge. Slight vaginal bleeding on pad.  Disposition: 01-Home or Self Care     Medication List    TAKE these medications       aspirin 81 MG tablet  Take 81 mg by mouth at bedtime.     cholecalciferol 1000 UNITS tablet  Commonly known as:  VITAMIN D  Take 2,000 Units by mouth daily.     ciprofloxacin 500 MG tablet  Commonly known as:  CIPRO  Take 1 tablet (500 mg total) by mouth 2 (two) times daily.     clotrimazole-betamethasone cream  Commonly known as:  LOTRISONE  Apply 1 application topically 2 (two) times daily.     glucose blood test strip  1 each by Other route as needed. Use as instructed     losartan 50 MG tablet  Commonly known as:  COZAAR  Take 50 mg by mouth every morning.     metFORMIN 500 MG (MOD) 24 hr tablet  Commonly known as:  GLUMETZA  Take 500 mg by mouth 2 (two) times daily with a meal.     mirtazapine 15 MG tablet  Commonly known as:  REMERON  Take 15 mg by mouth daily as needed (sleep).     MYRBETRIQ 50 MG Tb24  Generic drug:  mirabegron ER  Take 50 mg by mouth daily.     NONFORMULARY OR COMPOUNDED ITEM  Place 1  application vaginally 2 (two) times a week. Estradiol .02%  1 ML Prefilled Applicator  Sig: apply vaginally twice a week  #90 Day Supply with 4 refills on Tuesday and Thursday     oxyCODONE-acetaminophen 5-325 MG per tablet  Commonly known as:  ROXICET  Take 1 tablet by mouth every 6 (six) hours as needed for pain.     pravastatin 80 MG tablet  Commonly known as:  PRAVACHOL  Take 40 mg by mouth every morning.           Follow-up Information   Follow up with [name] I, MD. (Please call office for follow up)    Contact information:   [address] [place] [address] [phone]       Signed: [name] I 07/29/2012, 9:10 AM

## 2012-10-06 ENCOUNTER — Other Ambulatory Visit: Payer: Self-pay

## 2013-01-06 ENCOUNTER — Other Ambulatory Visit: Payer: Self-pay

## 2013-04-08 ENCOUNTER — Other Ambulatory Visit (HOSPITAL_COMMUNITY): Payer: Self-pay | Admitting: Internal Medicine

## 2013-04-08 DIAGNOSIS — Z1231 Encounter for screening mammogram for malignant neoplasm of breast: Secondary | ICD-10-CM

## 2013-04-13 ENCOUNTER — Ambulatory Visit (HOSPITAL_COMMUNITY)
Admission: RE | Admit: 2013-04-13 | Discharge: 2013-04-13 | Disposition: A | Discharge status: Home / Self Care | Payer: Medicare Other | Source: Ambulatory Visit | Attending: Internal Medicine | Admitting: Internal Medicine

## 2013-04-13 DIAGNOSIS — Z1231 Encounter for screening mammogram for malignant neoplasm of breast: Secondary | ICD-10-CM

## 2013-04-15 ENCOUNTER — Other Ambulatory Visit: Payer: Self-pay | Admitting: Internal Medicine

## 2013-04-15 DIAGNOSIS — R928 Other abnormal and inconclusive findings on diagnostic imaging of breast: Secondary | ICD-10-CM

## 2013-05-02 ENCOUNTER — Ambulatory Visit
Admission: RE | Admit: 2013-05-02 | Discharge: 2013-05-02 | Disposition: A | Discharge status: Home / Self Care | Payer: Medicare Other | Source: Ambulatory Visit | Attending: Internal Medicine | Admitting: Internal Medicine

## 2013-05-02 DIAGNOSIS — R928 Other abnormal and inconclusive findings on diagnostic imaging of breast: Secondary | ICD-10-CM

## 2014-01-02 ENCOUNTER — Encounter (HOSPITAL_COMMUNITY): Payer: Self-pay | Admitting: Urology

## 2014-04-19 DIAGNOSIS — M810 Age-related osteoporosis without current pathological fracture: Secondary | ICD-10-CM | POA: Diagnosis not present

## 2014-04-19 DIAGNOSIS — E118 Type 2 diabetes mellitus with unspecified complications: Secondary | ICD-10-CM | POA: Diagnosis not present

## 2014-04-19 DIAGNOSIS — E780 Pure hypercholesterolemia: Secondary | ICD-10-CM | POA: Diagnosis not present

## 2014-04-19 DIAGNOSIS — E119 Type 2 diabetes mellitus without complications: Secondary | ICD-10-CM | POA: Diagnosis not present

## 2014-04-24 DIAGNOSIS — I10 Essential (primary) hypertension: Secondary | ICD-10-CM | POA: Diagnosis not present

## 2014-04-24 DIAGNOSIS — E780 Pure hypercholesterolemia: Secondary | ICD-10-CM | POA: Diagnosis not present

## 2014-04-24 DIAGNOSIS — Z0001 Encounter for general adult medical examination with abnormal findings: Secondary | ICD-10-CM | POA: Diagnosis not present

## 2014-04-24 DIAGNOSIS — E1165 Type 2 diabetes mellitus with hyperglycemia: Secondary | ICD-10-CM | POA: Diagnosis not present

## 2014-05-29 ENCOUNTER — Other Ambulatory Visit: Payer: Self-pay

## 2014-05-29 DIAGNOSIS — Z1231 Encounter for screening mammogram for malignant neoplasm of breast: Secondary | ICD-10-CM

## 2014-06-06 ENCOUNTER — Ambulatory Visit: Payer: Self-pay

## 2014-06-12 ENCOUNTER — Ambulatory Visit
Admission: RE | Admit: 2014-06-12 | Discharge: 2014-06-12 | Disposition: A | Discharge status: Home / Self Care | Payer: Self-pay | Source: Ambulatory Visit

## 2014-06-12 DIAGNOSIS — Z1231 Encounter for screening mammogram for malignant neoplasm of breast: Secondary | ICD-10-CM | POA: Diagnosis not present

## 2014-07-24 DIAGNOSIS — Z634 Disappearance and death of family member: Secondary | ICD-10-CM | POA: Diagnosis not present

## 2014-07-24 DIAGNOSIS — E1165 Type 2 diabetes mellitus with hyperglycemia: Secondary | ICD-10-CM | POA: Diagnosis not present

## 2014-07-24 DIAGNOSIS — I10 Essential (primary) hypertension: Secondary | ICD-10-CM | POA: Diagnosis not present

## 2014-08-28 ENCOUNTER — Other Ambulatory Visit: Payer: Self-pay

## 2014-10-24 DIAGNOSIS — E780 Pure hypercholesterolemia: Secondary | ICD-10-CM | POA: Diagnosis not present

## 2014-10-24 DIAGNOSIS — E1165 Type 2 diabetes mellitus with hyperglycemia: Secondary | ICD-10-CM | POA: Diagnosis not present

## 2014-10-24 DIAGNOSIS — Z23 Encounter for immunization: Secondary | ICD-10-CM | POA: Diagnosis not present

## 2014-10-24 DIAGNOSIS — L84 Corns and callosities: Secondary | ICD-10-CM | POA: Diagnosis not present

## 2014-11-23 DIAGNOSIS — E1151 Type 2 diabetes mellitus with diabetic peripheral angiopathy without gangrene: Secondary | ICD-10-CM | POA: Diagnosis not present

## 2014-11-23 DIAGNOSIS — I739 Peripheral vascular disease, unspecified: Secondary | ICD-10-CM | POA: Diagnosis not present

## 2014-11-23 DIAGNOSIS — L84 Corns and callosities: Secondary | ICD-10-CM | POA: Diagnosis not present

## 2014-11-23 DIAGNOSIS — L603 Nail dystrophy: Secondary | ICD-10-CM | POA: Diagnosis not present

## 2015-01-10 DIAGNOSIS — B3749 Other urogenital candidiasis: Secondary | ICD-10-CM | POA: Diagnosis not present

## 2015-01-10 DIAGNOSIS — N393 Stress incontinence (female) (male): Secondary | ICD-10-CM | POA: Diagnosis not present

## 2015-01-17 DIAGNOSIS — I10 Essential (primary) hypertension: Secondary | ICD-10-CM | POA: Diagnosis not present

## 2015-01-17 DIAGNOSIS — E1165 Type 2 diabetes mellitus with hyperglycemia: Secondary | ICD-10-CM | POA: Diagnosis not present

## 2015-01-24 DIAGNOSIS — I10 Essential (primary) hypertension: Secondary | ICD-10-CM | POA: Diagnosis not present

## 2015-01-24 DIAGNOSIS — E7800 Pure hypercholesterolemia, unspecified: Secondary | ICD-10-CM | POA: Diagnosis not present

## 2015-01-24 DIAGNOSIS — E1165 Type 2 diabetes mellitus with hyperglycemia: Secondary | ICD-10-CM | POA: Diagnosis not present

## 2015-02-08 DIAGNOSIS — L603 Nail dystrophy: Secondary | ICD-10-CM | POA: Diagnosis not present

## 2015-02-08 DIAGNOSIS — I739 Peripheral vascular disease, unspecified: Secondary | ICD-10-CM | POA: Diagnosis not present

## 2015-02-08 DIAGNOSIS — E1151 Type 2 diabetes mellitus with diabetic peripheral angiopathy without gangrene: Secondary | ICD-10-CM | POA: Diagnosis not present

## 2015-03-27 DIAGNOSIS — H524 Presbyopia: Secondary | ICD-10-CM | POA: Diagnosis not present

## 2015-03-27 DIAGNOSIS — H52223 Regular astigmatism, bilateral: Secondary | ICD-10-CM | POA: Diagnosis not present

## 2015-03-27 DIAGNOSIS — H5212 Myopia, left eye: Secondary | ICD-10-CM | POA: Diagnosis not present

## 2015-04-09 DIAGNOSIS — T23012A Burn of unspecified degree of left thumb (nail), initial encounter: Secondary | ICD-10-CM | POA: Diagnosis not present

## 2015-04-24 DIAGNOSIS — E1151 Type 2 diabetes mellitus with diabetic peripheral angiopathy without gangrene: Secondary | ICD-10-CM | POA: Diagnosis not present

## 2015-04-24 DIAGNOSIS — I739 Peripheral vascular disease, unspecified: Secondary | ICD-10-CM | POA: Diagnosis not present

## 2015-04-24 DIAGNOSIS — L603 Nail dystrophy: Secondary | ICD-10-CM | POA: Diagnosis not present

## 2015-04-24 DIAGNOSIS — L400 Psoriasis vulgaris: Secondary | ICD-10-CM | POA: Diagnosis not present

## 2015-05-02 DIAGNOSIS — Z0001 Encounter for general adult medical examination with abnormal findings: Secondary | ICD-10-CM | POA: Diagnosis not present

## 2015-05-02 DIAGNOSIS — E7800 Pure hypercholesterolemia, unspecified: Secondary | ICD-10-CM | POA: Diagnosis not present

## 2015-05-02 DIAGNOSIS — M8580 Other specified disorders of bone density and structure, unspecified site: Secondary | ICD-10-CM | POA: Diagnosis not present

## 2015-05-02 DIAGNOSIS — E1165 Type 2 diabetes mellitus with hyperglycemia: Secondary | ICD-10-CM | POA: Diagnosis not present

## 2015-05-02 DIAGNOSIS — I10 Essential (primary) hypertension: Secondary | ICD-10-CM | POA: Diagnosis not present

## 2015-05-02 DIAGNOSIS — E559 Vitamin D deficiency, unspecified: Secondary | ICD-10-CM | POA: Diagnosis not present

## 2015-05-08 ENCOUNTER — Other Ambulatory Visit: Payer: Self-pay | Admitting: Internal Medicine

## 2015-05-08 DIAGNOSIS — Z0001 Encounter for general adult medical examination with abnormal findings: Secondary | ICD-10-CM | POA: Diagnosis not present

## 2015-05-08 DIAGNOSIS — E7800 Pure hypercholesterolemia, unspecified: Secondary | ICD-10-CM | POA: Diagnosis not present

## 2015-05-08 DIAGNOSIS — B351 Tinea unguium: Secondary | ICD-10-CM | POA: Diagnosis not present

## 2015-05-08 DIAGNOSIS — N6320 Unspecified lump in the left breast, unspecified quadrant: Secondary | ICD-10-CM

## 2015-05-08 DIAGNOSIS — Z23 Encounter for immunization: Secondary | ICD-10-CM | POA: Diagnosis not present

## 2015-05-08 DIAGNOSIS — I10 Essential (primary) hypertension: Secondary | ICD-10-CM | POA: Diagnosis not present

## 2015-05-08 DIAGNOSIS — E1165 Type 2 diabetes mellitus with hyperglycemia: Secondary | ICD-10-CM | POA: Diagnosis not present

## 2015-05-08 DIAGNOSIS — Z1212 Encounter for screening for malignant neoplasm of rectum: Secondary | ICD-10-CM | POA: Diagnosis not present

## 2015-05-11 ENCOUNTER — Ambulatory Visit
Admission: RE | Admit: 2015-05-11 | Discharge: 2015-05-11 | Disposition: A | Discharge status: Home / Self Care | Payer: Self-pay | Source: Ambulatory Visit | Attending: Internal Medicine | Admitting: Internal Medicine

## 2015-05-11 DIAGNOSIS — N6002 Solitary cyst of left breast: Secondary | ICD-10-CM | POA: Diagnosis not present

## 2015-05-11 DIAGNOSIS — N63 Unspecified lump in breast: Secondary | ICD-10-CM | POA: Diagnosis not present

## 2015-05-11 DIAGNOSIS — N6320 Unspecified lump in the left breast, unspecified quadrant: Secondary | ICD-10-CM

## 2015-06-12 DIAGNOSIS — M810 Age-related osteoporosis without current pathological fracture: Secondary | ICD-10-CM | POA: Diagnosis not present

## 2015-06-25 DIAGNOSIS — M8580 Other specified disorders of bone density and structure, unspecified site: Secondary | ICD-10-CM | POA: Diagnosis not present

## 2015-07-03 DIAGNOSIS — L603 Nail dystrophy: Secondary | ICD-10-CM | POA: Diagnosis not present

## 2015-07-03 DIAGNOSIS — E1151 Type 2 diabetes mellitus with diabetic peripheral angiopathy without gangrene: Secondary | ICD-10-CM | POA: Diagnosis not present

## 2015-07-03 DIAGNOSIS — L84 Corns and callosities: Secondary | ICD-10-CM | POA: Diagnosis not present

## 2015-07-03 DIAGNOSIS — I739 Peripheral vascular disease, unspecified: Secondary | ICD-10-CM | POA: Diagnosis not present

## 2015-07-04 ENCOUNTER — Other Ambulatory Visit: Payer: Self-pay

## 2015-07-04 DIAGNOSIS — Z1231 Encounter for screening mammogram for malignant neoplasm of breast: Secondary | ICD-10-CM

## 2015-07-25 ENCOUNTER — Ambulatory Visit
Admission: RE | Admit: 2015-07-25 | Discharge: 2015-07-25 | Disposition: A | Discharge status: Home / Self Care | Payer: Medicare Other | Source: Ambulatory Visit

## 2015-07-25 DIAGNOSIS — Z1231 Encounter for screening mammogram for malignant neoplasm of breast: Secondary | ICD-10-CM | POA: Diagnosis not present

## 2015-08-06 DIAGNOSIS — E1165 Type 2 diabetes mellitus with hyperglycemia: Secondary | ICD-10-CM | POA: Diagnosis not present

## 2015-08-08 DIAGNOSIS — E119 Type 2 diabetes mellitus without complications: Secondary | ICD-10-CM | POA: Diagnosis not present

## 2015-08-08 DIAGNOSIS — H47323 Drusen of optic disc, bilateral: Secondary | ICD-10-CM | POA: Diagnosis not present

## 2015-08-08 DIAGNOSIS — H26492 Other secondary cataract, left eye: Secondary | ICD-10-CM | POA: Diagnosis not present

## 2015-08-08 DIAGNOSIS — Z7984 Long term (current) use of oral hypoglycemic drugs: Secondary | ICD-10-CM | POA: Diagnosis not present

## 2015-08-09 DIAGNOSIS — E1165 Type 2 diabetes mellitus with hyperglycemia: Secondary | ICD-10-CM | POA: Diagnosis not present

## 2015-08-09 DIAGNOSIS — M8580 Other specified disorders of bone density and structure, unspecified site: Secondary | ICD-10-CM | POA: Diagnosis not present

## 2015-08-09 DIAGNOSIS — N63 Unspecified lump in breast: Secondary | ICD-10-CM | POA: Diagnosis not present

## 2015-08-14 DIAGNOSIS — M9904 Segmental and somatic dysfunction of sacral region: Secondary | ICD-10-CM | POA: Diagnosis not present

## 2015-08-14 DIAGNOSIS — M9903 Segmental and somatic dysfunction of lumbar region: Secondary | ICD-10-CM | POA: Diagnosis not present

## 2015-08-14 DIAGNOSIS — M5417 Radiculopathy, lumbosacral region: Secondary | ICD-10-CM | POA: Diagnosis not present

## 2015-08-14 DIAGNOSIS — M9902 Segmental and somatic dysfunction of thoracic region: Secondary | ICD-10-CM | POA: Diagnosis not present

## 2015-08-14 DIAGNOSIS — M5136 Other intervertebral disc degeneration, lumbar region: Secondary | ICD-10-CM | POA: Diagnosis not present

## 2015-08-15 DIAGNOSIS — M5417 Radiculopathy, lumbosacral region: Secondary | ICD-10-CM | POA: Diagnosis not present

## 2015-08-15 DIAGNOSIS — M9903 Segmental and somatic dysfunction of lumbar region: Secondary | ICD-10-CM | POA: Diagnosis not present

## 2015-08-15 DIAGNOSIS — M5136 Other intervertebral disc degeneration, lumbar region: Secondary | ICD-10-CM | POA: Diagnosis not present

## 2015-08-15 DIAGNOSIS — M9902 Segmental and somatic dysfunction of thoracic region: Secondary | ICD-10-CM | POA: Diagnosis not present

## 2015-08-15 DIAGNOSIS — M9904 Segmental and somatic dysfunction of sacral region: Secondary | ICD-10-CM | POA: Diagnosis not present

## 2015-08-17 DIAGNOSIS — M5136 Other intervertebral disc degeneration, lumbar region: Secondary | ICD-10-CM | POA: Diagnosis not present

## 2015-08-17 DIAGNOSIS — M9903 Segmental and somatic dysfunction of lumbar region: Secondary | ICD-10-CM | POA: Diagnosis not present

## 2015-08-17 DIAGNOSIS — M9902 Segmental and somatic dysfunction of thoracic region: Secondary | ICD-10-CM | POA: Diagnosis not present

## 2015-08-17 DIAGNOSIS — M5417 Radiculopathy, lumbosacral region: Secondary | ICD-10-CM | POA: Diagnosis not present

## 2015-08-17 DIAGNOSIS — M9904 Segmental and somatic dysfunction of sacral region: Secondary | ICD-10-CM | POA: Diagnosis not present

## 2015-08-20 DIAGNOSIS — M5136 Other intervertebral disc degeneration, lumbar region: Secondary | ICD-10-CM | POA: Diagnosis not present

## 2015-08-20 DIAGNOSIS — M9903 Segmental and somatic dysfunction of lumbar region: Secondary | ICD-10-CM | POA: Diagnosis not present

## 2015-08-20 DIAGNOSIS — M9904 Segmental and somatic dysfunction of sacral region: Secondary | ICD-10-CM | POA: Diagnosis not present

## 2015-08-20 DIAGNOSIS — M9902 Segmental and somatic dysfunction of thoracic region: Secondary | ICD-10-CM | POA: Diagnosis not present

## 2015-08-20 DIAGNOSIS — M5417 Radiculopathy, lumbosacral region: Secondary | ICD-10-CM | POA: Diagnosis not present

## 2015-08-22 DIAGNOSIS — M5136 Other intervertebral disc degeneration, lumbar region: Secondary | ICD-10-CM | POA: Diagnosis not present

## 2015-08-22 DIAGNOSIS — M9902 Segmental and somatic dysfunction of thoracic region: Secondary | ICD-10-CM | POA: Diagnosis not present

## 2015-08-22 DIAGNOSIS — M9903 Segmental and somatic dysfunction of lumbar region: Secondary | ICD-10-CM | POA: Diagnosis not present

## 2015-08-22 DIAGNOSIS — M5417 Radiculopathy, lumbosacral region: Secondary | ICD-10-CM | POA: Diagnosis not present

## 2015-08-22 DIAGNOSIS — M9904 Segmental and somatic dysfunction of sacral region: Secondary | ICD-10-CM | POA: Diagnosis not present

## 2015-08-24 DIAGNOSIS — M5136 Other intervertebral disc degeneration, lumbar region: Secondary | ICD-10-CM | POA: Diagnosis not present

## 2015-08-24 DIAGNOSIS — M5417 Radiculopathy, lumbosacral region: Secondary | ICD-10-CM | POA: Diagnosis not present

## 2015-08-24 DIAGNOSIS — M9904 Segmental and somatic dysfunction of sacral region: Secondary | ICD-10-CM | POA: Diagnosis not present

## 2015-08-24 DIAGNOSIS — M9902 Segmental and somatic dysfunction of thoracic region: Secondary | ICD-10-CM | POA: Diagnosis not present

## 2015-08-24 DIAGNOSIS — M9903 Segmental and somatic dysfunction of lumbar region: Secondary | ICD-10-CM | POA: Diagnosis not present

## 2015-08-27 DIAGNOSIS — M5136 Other intervertebral disc degeneration, lumbar region: Secondary | ICD-10-CM | POA: Diagnosis not present

## 2015-08-27 DIAGNOSIS — M5417 Radiculopathy, lumbosacral region: Secondary | ICD-10-CM | POA: Diagnosis not present

## 2015-08-27 DIAGNOSIS — M9902 Segmental and somatic dysfunction of thoracic region: Secondary | ICD-10-CM | POA: Diagnosis not present

## 2015-08-27 DIAGNOSIS — M9903 Segmental and somatic dysfunction of lumbar region: Secondary | ICD-10-CM | POA: Diagnosis not present

## 2015-08-27 DIAGNOSIS — M9904 Segmental and somatic dysfunction of sacral region: Secondary | ICD-10-CM | POA: Diagnosis not present

## 2015-08-29 DIAGNOSIS — M5417 Radiculopathy, lumbosacral region: Secondary | ICD-10-CM | POA: Diagnosis not present

## 2015-08-29 DIAGNOSIS — M9903 Segmental and somatic dysfunction of lumbar region: Secondary | ICD-10-CM | POA: Diagnosis not present

## 2015-08-29 DIAGNOSIS — M9904 Segmental and somatic dysfunction of sacral region: Secondary | ICD-10-CM | POA: Diagnosis not present

## 2015-08-29 DIAGNOSIS — M5136 Other intervertebral disc degeneration, lumbar region: Secondary | ICD-10-CM | POA: Diagnosis not present

## 2015-08-29 DIAGNOSIS — M9902 Segmental and somatic dysfunction of thoracic region: Secondary | ICD-10-CM | POA: Diagnosis not present

## 2015-08-31 DIAGNOSIS — M5417 Radiculopathy, lumbosacral region: Secondary | ICD-10-CM | POA: Diagnosis not present

## 2015-08-31 DIAGNOSIS — M9904 Segmental and somatic dysfunction of sacral region: Secondary | ICD-10-CM | POA: Diagnosis not present

## 2015-08-31 DIAGNOSIS — M9902 Segmental and somatic dysfunction of thoracic region: Secondary | ICD-10-CM | POA: Diagnosis not present

## 2015-08-31 DIAGNOSIS — M5136 Other intervertebral disc degeneration, lumbar region: Secondary | ICD-10-CM | POA: Diagnosis not present

## 2015-08-31 DIAGNOSIS — M9903 Segmental and somatic dysfunction of lumbar region: Secondary | ICD-10-CM | POA: Diagnosis not present

## 2015-09-05 DIAGNOSIS — M9903 Segmental and somatic dysfunction of lumbar region: Secondary | ICD-10-CM | POA: Diagnosis not present

## 2015-09-05 DIAGNOSIS — M5417 Radiculopathy, lumbosacral region: Secondary | ICD-10-CM | POA: Diagnosis not present

## 2015-09-05 DIAGNOSIS — M9902 Segmental and somatic dysfunction of thoracic region: Secondary | ICD-10-CM | POA: Diagnosis not present

## 2015-09-05 DIAGNOSIS — M9904 Segmental and somatic dysfunction of sacral region: Secondary | ICD-10-CM | POA: Diagnosis not present

## 2015-09-05 DIAGNOSIS — M5136 Other intervertebral disc degeneration, lumbar region: Secondary | ICD-10-CM | POA: Diagnosis not present

## 2015-09-07 DIAGNOSIS — M9902 Segmental and somatic dysfunction of thoracic region: Secondary | ICD-10-CM | POA: Diagnosis not present

## 2015-09-07 DIAGNOSIS — M9903 Segmental and somatic dysfunction of lumbar region: Secondary | ICD-10-CM | POA: Diagnosis not present

## 2015-09-07 DIAGNOSIS — M5136 Other intervertebral disc degeneration, lumbar region: Secondary | ICD-10-CM | POA: Diagnosis not present

## 2015-09-07 DIAGNOSIS — M5417 Radiculopathy, lumbosacral region: Secondary | ICD-10-CM | POA: Diagnosis not present

## 2015-09-07 DIAGNOSIS — M9904 Segmental and somatic dysfunction of sacral region: Secondary | ICD-10-CM | POA: Diagnosis not present

## 2015-09-10 DIAGNOSIS — M5417 Radiculopathy, lumbosacral region: Secondary | ICD-10-CM | POA: Diagnosis not present

## 2015-09-10 DIAGNOSIS — M9904 Segmental and somatic dysfunction of sacral region: Secondary | ICD-10-CM | POA: Diagnosis not present

## 2015-09-10 DIAGNOSIS — M9903 Segmental and somatic dysfunction of lumbar region: Secondary | ICD-10-CM | POA: Diagnosis not present

## 2015-09-10 DIAGNOSIS — M5136 Other intervertebral disc degeneration, lumbar region: Secondary | ICD-10-CM | POA: Diagnosis not present

## 2015-09-10 DIAGNOSIS — M9902 Segmental and somatic dysfunction of thoracic region: Secondary | ICD-10-CM | POA: Diagnosis not present

## 2015-09-12 DIAGNOSIS — M9904 Segmental and somatic dysfunction of sacral region: Secondary | ICD-10-CM | POA: Diagnosis not present

## 2015-09-12 DIAGNOSIS — M5417 Radiculopathy, lumbosacral region: Secondary | ICD-10-CM | POA: Diagnosis not present

## 2015-09-12 DIAGNOSIS — M9903 Segmental and somatic dysfunction of lumbar region: Secondary | ICD-10-CM | POA: Diagnosis not present

## 2015-09-12 DIAGNOSIS — M9902 Segmental and somatic dysfunction of thoracic region: Secondary | ICD-10-CM | POA: Diagnosis not present

## 2015-09-12 DIAGNOSIS — M5136 Other intervertebral disc degeneration, lumbar region: Secondary | ICD-10-CM | POA: Diagnosis not present

## 2015-09-14 DIAGNOSIS — M9902 Segmental and somatic dysfunction of thoracic region: Secondary | ICD-10-CM | POA: Diagnosis not present

## 2015-09-14 DIAGNOSIS — M5136 Other intervertebral disc degeneration, lumbar region: Secondary | ICD-10-CM | POA: Diagnosis not present

## 2015-09-14 DIAGNOSIS — M9904 Segmental and somatic dysfunction of sacral region: Secondary | ICD-10-CM | POA: Diagnosis not present

## 2015-09-14 DIAGNOSIS — M9903 Segmental and somatic dysfunction of lumbar region: Secondary | ICD-10-CM | POA: Diagnosis not present

## 2015-09-14 DIAGNOSIS — M5417 Radiculopathy, lumbosacral region: Secondary | ICD-10-CM | POA: Diagnosis not present

## 2015-09-17 DIAGNOSIS — M9902 Segmental and somatic dysfunction of thoracic region: Secondary | ICD-10-CM | POA: Diagnosis not present

## 2015-09-17 DIAGNOSIS — M5417 Radiculopathy, lumbosacral region: Secondary | ICD-10-CM | POA: Diagnosis not present

## 2015-09-17 DIAGNOSIS — M9904 Segmental and somatic dysfunction of sacral region: Secondary | ICD-10-CM | POA: Diagnosis not present

## 2015-09-17 DIAGNOSIS — M9903 Segmental and somatic dysfunction of lumbar region: Secondary | ICD-10-CM | POA: Diagnosis not present

## 2015-09-17 DIAGNOSIS — M5136 Other intervertebral disc degeneration, lumbar region: Secondary | ICD-10-CM | POA: Diagnosis not present

## 2015-09-19 DIAGNOSIS — M9904 Segmental and somatic dysfunction of sacral region: Secondary | ICD-10-CM | POA: Diagnosis not present

## 2015-09-19 DIAGNOSIS — M5417 Radiculopathy, lumbosacral region: Secondary | ICD-10-CM | POA: Diagnosis not present

## 2015-09-19 DIAGNOSIS — M9903 Segmental and somatic dysfunction of lumbar region: Secondary | ICD-10-CM | POA: Diagnosis not present

## 2015-09-19 DIAGNOSIS — M5136 Other intervertebral disc degeneration, lumbar region: Secondary | ICD-10-CM | POA: Diagnosis not present

## 2015-09-19 DIAGNOSIS — M9902 Segmental and somatic dysfunction of thoracic region: Secondary | ICD-10-CM | POA: Diagnosis not present

## 2015-10-02 DIAGNOSIS — I739 Peripheral vascular disease, unspecified: Secondary | ICD-10-CM | POA: Diagnosis not present

## 2015-10-02 DIAGNOSIS — L603 Nail dystrophy: Secondary | ICD-10-CM | POA: Diagnosis not present

## 2015-10-02 DIAGNOSIS — E1151 Type 2 diabetes mellitus with diabetic peripheral angiopathy without gangrene: Secondary | ICD-10-CM | POA: Diagnosis not present

## 2015-10-17 DIAGNOSIS — M9902 Segmental and somatic dysfunction of thoracic region: Secondary | ICD-10-CM | POA: Diagnosis not present

## 2015-10-17 DIAGNOSIS — M5136 Other intervertebral disc degeneration, lumbar region: Secondary | ICD-10-CM | POA: Diagnosis not present

## 2015-10-17 DIAGNOSIS — M9903 Segmental and somatic dysfunction of lumbar region: Secondary | ICD-10-CM | POA: Diagnosis not present

## 2015-10-17 DIAGNOSIS — M5417 Radiculopathy, lumbosacral region: Secondary | ICD-10-CM | POA: Diagnosis not present

## 2015-10-17 DIAGNOSIS — M9904 Segmental and somatic dysfunction of sacral region: Secondary | ICD-10-CM | POA: Diagnosis not present

## 2015-11-09 DIAGNOSIS — R600 Localized edema: Secondary | ICD-10-CM | POA: Diagnosis not present

## 2015-11-09 DIAGNOSIS — E1165 Type 2 diabetes mellitus with hyperglycemia: Secondary | ICD-10-CM | POA: Diagnosis not present

## 2015-11-09 DIAGNOSIS — I10 Essential (primary) hypertension: Secondary | ICD-10-CM | POA: Diagnosis not present

## 2016-01-08 DIAGNOSIS — E1142 Type 2 diabetes mellitus with diabetic polyneuropathy: Secondary | ICD-10-CM | POA: Diagnosis not present

## 2016-02-12 DIAGNOSIS — R600 Localized edema: Secondary | ICD-10-CM | POA: Diagnosis not present

## 2016-02-12 DIAGNOSIS — E1165 Type 2 diabetes mellitus with hyperglycemia: Secondary | ICD-10-CM | POA: Diagnosis not present

## 2016-02-12 DIAGNOSIS — I10 Essential (primary) hypertension: Secondary | ICD-10-CM | POA: Diagnosis not present

## 2016-05-19 DIAGNOSIS — E559 Vitamin D deficiency, unspecified: Secondary | ICD-10-CM | POA: Diagnosis not present

## 2016-05-19 DIAGNOSIS — E1165 Type 2 diabetes mellitus with hyperglycemia: Secondary | ICD-10-CM | POA: Diagnosis not present

## 2016-05-19 DIAGNOSIS — Z0000 Encounter for general adult medical examination without abnormal findings: Secondary | ICD-10-CM | POA: Diagnosis not present

## 2016-05-22 DIAGNOSIS — N8111 Cystocele, midline: Secondary | ICD-10-CM | POA: Diagnosis not present

## 2016-05-22 DIAGNOSIS — E559 Vitamin D deficiency, unspecified: Secondary | ICD-10-CM | POA: Diagnosis not present

## 2016-05-22 DIAGNOSIS — Z1212 Encounter for screening for malignant neoplasm of rectum: Secondary | ICD-10-CM | POA: Diagnosis not present

## 2016-05-22 DIAGNOSIS — E785 Hyperlipidemia, unspecified: Secondary | ICD-10-CM | POA: Diagnosis not present

## 2016-05-22 DIAGNOSIS — G4700 Insomnia, unspecified: Secondary | ICD-10-CM | POA: Diagnosis not present

## 2016-05-27 DIAGNOSIS — L603 Nail dystrophy: Secondary | ICD-10-CM | POA: Diagnosis not present

## 2016-05-27 DIAGNOSIS — E1151 Type 2 diabetes mellitus with diabetic peripheral angiopathy without gangrene: Secondary | ICD-10-CM | POA: Diagnosis not present

## 2016-05-27 DIAGNOSIS — I739 Peripheral vascular disease, unspecified: Secondary | ICD-10-CM | POA: Diagnosis not present

## 2016-06-09 DIAGNOSIS — Z1211 Encounter for screening for malignant neoplasm of colon: Secondary | ICD-10-CM | POA: Diagnosis not present

## 2016-06-09 DIAGNOSIS — Z1212 Encounter for screening for malignant neoplasm of rectum: Secondary | ICD-10-CM | POA: Diagnosis not present

## 2016-07-18 DIAGNOSIS — E1142 Type 2 diabetes mellitus with diabetic polyneuropathy: Secondary | ICD-10-CM | POA: Diagnosis not present

## 2016-07-18 DIAGNOSIS — M898X7 Other specified disorders of bone, ankle and foot: Secondary | ICD-10-CM | POA: Diagnosis not present

## 2016-07-18 DIAGNOSIS — L97519 Non-pressure chronic ulcer of other part of right foot with unspecified severity: Secondary | ICD-10-CM | POA: Diagnosis not present

## 2016-08-19 DIAGNOSIS — L603 Nail dystrophy: Secondary | ICD-10-CM | POA: Diagnosis not present

## 2016-08-19 DIAGNOSIS — E1142 Type 2 diabetes mellitus with diabetic polyneuropathy: Secondary | ICD-10-CM | POA: Diagnosis not present

## 2016-08-19 DIAGNOSIS — I739 Peripheral vascular disease, unspecified: Secondary | ICD-10-CM | POA: Diagnosis not present

## 2016-08-20 DIAGNOSIS — Z961 Presence of intraocular lens: Secondary | ICD-10-CM | POA: Diagnosis not present

## 2016-08-20 DIAGNOSIS — H52223 Regular astigmatism, bilateral: Secondary | ICD-10-CM | POA: Diagnosis not present

## 2016-08-20 DIAGNOSIS — E119 Type 2 diabetes mellitus without complications: Secondary | ICD-10-CM | POA: Diagnosis not present

## 2016-11-11 DIAGNOSIS — E1142 Type 2 diabetes mellitus with diabetic polyneuropathy: Secondary | ICD-10-CM | POA: Diagnosis not present

## 2017-02-17 DIAGNOSIS — E1151 Type 2 diabetes mellitus with diabetic peripheral angiopathy without gangrene: Secondary | ICD-10-CM | POA: Diagnosis not present

## 2017-02-17 DIAGNOSIS — I739 Peripheral vascular disease, unspecified: Secondary | ICD-10-CM | POA: Diagnosis not present

## 2017-02-17 DIAGNOSIS — L603 Nail dystrophy: Secondary | ICD-10-CM | POA: Diagnosis not present

## 2017-05-12 DIAGNOSIS — E1142 Type 2 diabetes mellitus with diabetic polyneuropathy: Secondary | ICD-10-CM | POA: Diagnosis not present

## 2017-05-15 DIAGNOSIS — M720 Palmar fascial fibromatosis [Dupuytren]: Secondary | ICD-10-CM | POA: Diagnosis not present

## 2017-05-25 DIAGNOSIS — E559 Vitamin D deficiency, unspecified: Secondary | ICD-10-CM | POA: Diagnosis not present

## 2017-05-25 DIAGNOSIS — I10 Essential (primary) hypertension: Secondary | ICD-10-CM | POA: Diagnosis not present

## 2017-05-25 DIAGNOSIS — E1165 Type 2 diabetes mellitus with hyperglycemia: Secondary | ICD-10-CM | POA: Diagnosis not present

## 2017-05-28 DIAGNOSIS — E1121 Type 2 diabetes mellitus with diabetic nephropathy: Secondary | ICD-10-CM | POA: Diagnosis not present

## 2017-05-28 DIAGNOSIS — E559 Vitamin D deficiency, unspecified: Secondary | ICD-10-CM | POA: Diagnosis not present

## 2017-05-28 DIAGNOSIS — Z0001 Encounter for general adult medical examination with abnormal findings: Secondary | ICD-10-CM | POA: Diagnosis not present

## 2017-05-28 DIAGNOSIS — I10 Essential (primary) hypertension: Secondary | ICD-10-CM | POA: Diagnosis not present

## 2017-05-28 DIAGNOSIS — M720 Palmar fascial fibromatosis [Dupuytren]: Secondary | ICD-10-CM | POA: Diagnosis not present

## 2017-06-08 DIAGNOSIS — E119 Type 2 diabetes mellitus without complications: Secondary | ICD-10-CM | POA: Diagnosis not present

## 2017-06-08 DIAGNOSIS — I10 Essential (primary) hypertension: Secondary | ICD-10-CM | POA: Diagnosis not present

## 2017-06-17 DIAGNOSIS — M810 Age-related osteoporosis without current pathological fracture: Secondary | ICD-10-CM | POA: Diagnosis not present

## 2017-07-15 DIAGNOSIS — E1140 Type 2 diabetes mellitus with diabetic neuropathy, unspecified: Secondary | ICD-10-CM | POA: Diagnosis not present

## 2017-07-15 DIAGNOSIS — H6120 Impacted cerumen, unspecified ear: Secondary | ICD-10-CM | POA: Diagnosis not present

## 2017-07-15 DIAGNOSIS — I10 Essential (primary) hypertension: Secondary | ICD-10-CM | POA: Diagnosis not present

## 2017-07-28 DIAGNOSIS — I739 Peripheral vascular disease, unspecified: Secondary | ICD-10-CM | POA: Diagnosis not present

## 2017-07-28 DIAGNOSIS — L603 Nail dystrophy: Secondary | ICD-10-CM | POA: Diagnosis not present

## 2017-07-28 DIAGNOSIS — L600 Ingrowing nail: Secondary | ICD-10-CM | POA: Diagnosis not present

## 2017-07-28 DIAGNOSIS — E1151 Type 2 diabetes mellitus with diabetic peripheral angiopathy without gangrene: Secondary | ICD-10-CM | POA: Diagnosis not present

## 2017-08-04 DIAGNOSIS — L600 Ingrowing nail: Secondary | ICD-10-CM | POA: Diagnosis not present

## 2017-08-17 DIAGNOSIS — E119 Type 2 diabetes mellitus without complications: Secondary | ICD-10-CM | POA: Diagnosis not present

## 2017-08-17 DIAGNOSIS — H524 Presbyopia: Secondary | ICD-10-CM | POA: Diagnosis not present

## 2017-08-17 DIAGNOSIS — H52223 Regular astigmatism, bilateral: Secondary | ICD-10-CM | POA: Diagnosis not present

## 2017-09-07 DIAGNOSIS — I10 Essential (primary) hypertension: Secondary | ICD-10-CM | POA: Diagnosis not present

## 2017-09-07 DIAGNOSIS — E7800 Pure hypercholesterolemia, unspecified: Secondary | ICD-10-CM | POA: Diagnosis not present

## 2017-09-07 DIAGNOSIS — E119 Type 2 diabetes mellitus without complications: Secondary | ICD-10-CM | POA: Diagnosis not present

## 2017-10-07 ENCOUNTER — Other Ambulatory Visit: Payer: Self-pay | Admitting: Internal Medicine

## 2017-10-07 DIAGNOSIS — Z1231 Encounter for screening mammogram for malignant neoplasm of breast: Secondary | ICD-10-CM

## 2017-10-08 DIAGNOSIS — Z1239 Encounter for other screening for malignant neoplasm of breast: Secondary | ICD-10-CM | POA: Diagnosis not present

## 2017-10-08 DIAGNOSIS — N63 Unspecified lump in unspecified breast: Secondary | ICD-10-CM | POA: Diagnosis not present

## 2017-10-20 DIAGNOSIS — M79672 Pain in left foot: Secondary | ICD-10-CM | POA: Diagnosis not present

## 2017-10-20 DIAGNOSIS — M79671 Pain in right foot: Secondary | ICD-10-CM | POA: Diagnosis not present

## 2017-10-20 DIAGNOSIS — M792 Neuralgia and neuritis, unspecified: Secondary | ICD-10-CM | POA: Diagnosis not present

## 2017-11-09 ENCOUNTER — Ambulatory Visit
Admission: RE | Admit: 2017-11-09 | Discharge: 2017-11-09 | Disposition: A | Discharge status: Home / Self Care | Payer: Medicare Other | Source: Ambulatory Visit | Attending: Internal Medicine | Admitting: Internal Medicine

## 2017-11-09 DIAGNOSIS — Z1231 Encounter for screening mammogram for malignant neoplasm of breast: Secondary | ICD-10-CM | POA: Diagnosis not present

## 2017-12-08 DIAGNOSIS — E119 Type 2 diabetes mellitus without complications: Secondary | ICD-10-CM | POA: Diagnosis not present

## 2017-12-08 DIAGNOSIS — Z23 Encounter for immunization: Secondary | ICD-10-CM | POA: Diagnosis not present

## 2017-12-08 DIAGNOSIS — E7800 Pure hypercholesterolemia, unspecified: Secondary | ICD-10-CM | POA: Diagnosis not present

## 2017-12-08 DIAGNOSIS — I10 Essential (primary) hypertension: Secondary | ICD-10-CM | POA: Diagnosis not present

## 2018-01-13 DIAGNOSIS — L84 Corns and callosities: Secondary | ICD-10-CM | POA: Diagnosis not present

## 2018-01-13 DIAGNOSIS — I739 Peripheral vascular disease, unspecified: Secondary | ICD-10-CM | POA: Diagnosis not present

## 2018-01-13 DIAGNOSIS — L600 Ingrowing nail: Secondary | ICD-10-CM | POA: Diagnosis not present

## 2018-01-13 DIAGNOSIS — E1151 Type 2 diabetes mellitus with diabetic peripheral angiopathy without gangrene: Secondary | ICD-10-CM | POA: Diagnosis not present

## 2018-04-14 DIAGNOSIS — I739 Peripheral vascular disease, unspecified: Secondary | ICD-10-CM | POA: Diagnosis not present

## 2018-04-14 DIAGNOSIS — E1151 Type 2 diabetes mellitus with diabetic peripheral angiopathy without gangrene: Secondary | ICD-10-CM | POA: Diagnosis not present

## 2018-04-14 DIAGNOSIS — L84 Corns and callosities: Secondary | ICD-10-CM | POA: Diagnosis not present

## 2018-04-14 DIAGNOSIS — L603 Nail dystrophy: Secondary | ICD-10-CM | POA: Diagnosis not present

## 2018-05-27 DIAGNOSIS — E1165 Type 2 diabetes mellitus with hyperglycemia: Secondary | ICD-10-CM | POA: Diagnosis not present

## 2018-05-27 DIAGNOSIS — I10 Essential (primary) hypertension: Secondary | ICD-10-CM | POA: Diagnosis not present

## 2018-05-31 DIAGNOSIS — I10 Essential (primary) hypertension: Secondary | ICD-10-CM | POA: Diagnosis not present

## 2018-05-31 DIAGNOSIS — E1165 Type 2 diabetes mellitus with hyperglycemia: Secondary | ICD-10-CM | POA: Diagnosis not present

## 2018-05-31 DIAGNOSIS — Z0000 Encounter for general adult medical examination without abnormal findings: Secondary | ICD-10-CM | POA: Diagnosis not present

## 2018-08-09 DIAGNOSIS — E785 Hyperlipidemia, unspecified: Secondary | ICD-10-CM | POA: Diagnosis not present

## 2018-08-09 DIAGNOSIS — E119 Type 2 diabetes mellitus without complications: Secondary | ICD-10-CM | POA: Diagnosis not present

## 2018-08-09 DIAGNOSIS — I10 Essential (primary) hypertension: Secondary | ICD-10-CM | POA: Diagnosis not present

## 2018-10-18 ENCOUNTER — Other Ambulatory Visit: Payer: Self-pay | Admitting: Internal Medicine

## 2018-10-18 DIAGNOSIS — Z1231 Encounter for screening mammogram for malignant neoplasm of breast: Secondary | ICD-10-CM

## 2018-11-19 ENCOUNTER — Ambulatory Visit
Admission: RE | Admit: 2018-11-19 | Discharge: 2018-11-19 | Disposition: A | Discharge status: Home / Self Care | Payer: Medicare Other | Source: Ambulatory Visit | Attending: Internal Medicine | Admitting: Internal Medicine

## 2018-11-19 ENCOUNTER — Other Ambulatory Visit: Payer: Self-pay

## 2018-11-19 DIAGNOSIS — Z1231 Encounter for screening mammogram for malignant neoplasm of breast: Secondary | ICD-10-CM | POA: Diagnosis not present

## 2018-11-22 ENCOUNTER — Other Ambulatory Visit: Payer: Self-pay | Admitting: Internal Medicine

## 2018-11-22 DIAGNOSIS — R928 Other abnormal and inconclusive findings on diagnostic imaging of breast: Secondary | ICD-10-CM

## 2018-11-24 ENCOUNTER — Other Ambulatory Visit: Payer: Self-pay | Admitting: Internal Medicine

## 2018-11-24 ENCOUNTER — Other Ambulatory Visit: Payer: Self-pay

## 2018-11-24 ENCOUNTER — Ambulatory Visit
Admission: RE | Admit: 2018-11-24 | Discharge: 2018-11-24 | Disposition: A | Discharge status: Home / Self Care | Payer: Medicare Other | Source: Ambulatory Visit | Attending: Internal Medicine | Admitting: Internal Medicine

## 2018-11-24 DIAGNOSIS — N6489 Other specified disorders of breast: Secondary | ICD-10-CM

## 2018-11-24 DIAGNOSIS — R928 Other abnormal and inconclusive findings on diagnostic imaging of breast: Secondary | ICD-10-CM

## 2018-11-24 DIAGNOSIS — R922 Inconclusive mammogram: Secondary | ICD-10-CM | POA: Diagnosis not present

## 2018-11-24 DIAGNOSIS — E1165 Type 2 diabetes mellitus with hyperglycemia: Secondary | ICD-10-CM | POA: Diagnosis not present

## 2018-11-24 DIAGNOSIS — E785 Hyperlipidemia, unspecified: Secondary | ICD-10-CM | POA: Diagnosis not present

## 2018-11-26 ENCOUNTER — Other Ambulatory Visit: Payer: Self-pay

## 2018-11-26 ENCOUNTER — Other Ambulatory Visit (HOSPITAL_COMMUNITY): Payer: Self-pay | Admitting: Diagnostic Radiology

## 2018-11-26 ENCOUNTER — Ambulatory Visit
Admission: RE | Admit: 2018-11-26 | Discharge: 2018-11-26 | Disposition: A | Discharge status: Home / Self Care | Payer: Medicare Other | Source: Ambulatory Visit | Attending: Internal Medicine | Admitting: Internal Medicine

## 2018-11-26 DIAGNOSIS — N6489 Other specified disorders of breast: Secondary | ICD-10-CM | POA: Diagnosis not present

## 2018-12-01 ENCOUNTER — Other Ambulatory Visit: Payer: Self-pay

## 2018-12-01 DIAGNOSIS — Z23 Encounter for immunization: Secondary | ICD-10-CM | POA: Diagnosis not present

## 2018-12-01 DIAGNOSIS — E1121 Type 2 diabetes mellitus with diabetic nephropathy: Secondary | ICD-10-CM | POA: Diagnosis not present

## 2018-12-01 DIAGNOSIS — I10 Essential (primary) hypertension: Secondary | ICD-10-CM | POA: Diagnosis not present

## 2018-12-01 NOTE — Patient Outreach (Signed)
[facility]) Care Management  12/01/2018  Kerrianne Canete Dec 01, 1930 [id number]   Medication Adherence call to Mrs. Leda Roys HIPPA Compliant Voice message left with a call back number. Mrs. Prudent is showing past due on Metformin Er 500 mg under [place].   [place] Management Direct Dial [phone]  Fax [phone] [email or web address]

## 2018-12-14 ENCOUNTER — Other Ambulatory Visit: Payer: Self-pay | Admitting: General Surgery

## 2018-12-14 DIAGNOSIS — K219 Gastro-esophageal reflux disease without esophagitis: Secondary | ICD-10-CM | POA: Diagnosis not present

## 2018-12-14 DIAGNOSIS — R928 Other abnormal and inconclusive findings on diagnostic imaging of breast: Secondary | ICD-10-CM | POA: Diagnosis not present

## 2018-12-14 DIAGNOSIS — E119 Type 2 diabetes mellitus without complications: Secondary | ICD-10-CM | POA: Diagnosis not present

## 2018-12-14 DIAGNOSIS — I10 Essential (primary) hypertension: Secondary | ICD-10-CM | POA: Diagnosis not present

## 2018-12-16 ENCOUNTER — Other Ambulatory Visit: Payer: Self-pay | Admitting: General Surgery

## 2018-12-16 DIAGNOSIS — R928 Other abnormal and inconclusive findings on diagnostic imaging of breast: Secondary | ICD-10-CM

## 2018-12-29 NOTE — Progress Notes (Signed)
[id number] [address], [address] AT [facility] [address] [place] [address] Phone: [phone] Fax: [phone]      Your procedure is scheduled on Tuesday 01/04/2019.  Report to [facility] Main Entrance "A" at 0730 A.M., and check in at the Admitting office.  Call this number if you have problems the morning of surgery:  [phone]  Call [phone] if you have any questions prior to your surgery date Monday-Friday 8am-4pm    Remember:  Do not eat after midnight the night before your surgery  You may drink clear liquids until 06:30 the morning of your surgery.   Clear liquids allowed are: Water, Non-Citrus Juices (without pulp), Carbonated Beverages, Clear Tea, Black Coffee Only, and Gatorade    Take these medicines the morning of surgery with A SIP OF WATER: Pravastatin (Pravachol)  7 days prior to surgery STOP taking any Aspirin (unless otherwise instructed by your surgeon), Aleve, Naproxen, Ibuprofen, Motrin, Advil, Goody's, BC's, all herbal medications, fish oil, and all vitamins.  Follow your surgeon's instructions on when to stop Aspirin.  If no instructions were given by your surgeon then you will need to call the office to get those instructions.    WHAT DO I DO ABOUT MY DIABETES MEDICATION?  Marland Kitchen Do not take oral diabetes medicines (pills) the morning of surgery. - DO NOT take Metformin (Glucophage) the morning of surgery.    How to Manage Your Diabetes Before and After Surgery  Why is it important to control my blood sugar before and after surgery? . Improving blood sugar levels before and after surgery helps healing and can limit problems. . A way of improving blood sugar control is eating a healthy diet by: o  Eating less sugar and carbohydrates o  Increasing activity/exercise o  Talking with your doctor about reaching your blood sugar goals . High blood sugars (greater than 180 mg/dL) can raise  your risk of infections and slow your recovery, so you will need to focus on controlling your diabetes during the weeks before surgery. . Make sure that the doctor who takes care of your diabetes knows about your planned surgery including the date and location.  How do I manage my blood sugar before surgery? . Check your blood sugar at least 4 times a day, starting 2 days before surgery, to make sure that the level is not too high or low. . Check your blood sugar the morning of your surgery when you wake up and every 2 hours until you get to the Short Stay unit. o If your blood sugar is less than 70 mg/dL, you will need to treat for low blood sugar: o Do not take insulin. o Treat a low blood sugar (less than 70 mg/dL) with  cup of clear juice (cranberry or apple), 4 glucose tablets, OR glucose gel. o Recheck blood sugar in 15 minutes after treatment (to make sure it is greater than 70 mg/dL). If your blood sugar is not greater than 70 mg/dL on recheck, call [phone] for further instructions. . Report your blood sugar to the short stay nurse when you get to Short Stay.  . If you are admitted to the hospital after surgery: o Your blood sugar will be checked by the staff and you will probably be given insulin after surgery (instead of oral diabetes medicines) to make sure you have good blood sugar levels. o The goal for blood sugar control  after surgery is 80-180 mg/dL.     The Morning of Surgery  Do not wear jewelry, make-up or nail polish.  Do not wear lotions, powders, perfumes, or deodorant  Do not shave 48 hours prior to surgery.    Do not bring valuables to the hospital.  [facility] is not responsible for any belongings or valuables.  If you are a smoker, DO NOT Smoke 24 hours prior to surgery  If you wear a CPAP at night please bring your mask, tubing, and machine the morning of surgery   Remember that you must have someone to transport you home after your surgery, and remain  with you for 24 hours if you are discharged the same day.   Contacts, eyeglasses, hearing aids, dentures or bridgework may not be worn into surgery.    Leave your suitcase in the car.  After surgery it may be brought to your room.  For patients admitted to the hospital, discharge time will be determined by your treatment team.  Patients discharged the day of surgery will not be allowed to drive home.    Special instructions:   [place]- Preparing For Surgery  Before surgery, you can play an important role. Because skin is not sterile, your skin needs to be as free of germs as possible. You can reduce the number of germs on your skin by washing with CHG (chlorahexidine gluconate) Soap before surgery.  CHG is an antiseptic cleaner which kills germs and bonds with the skin to continue killing germs even after washing.    Oral Hygiene is also important to reduce your risk of infection.  Remember - BRUSH YOUR TEETH THE MORNING OF SURGERY WITH YOUR REGULAR TOOTHPASTE  Please do not use if you have an allergy to CHG or antibacterial soaps. If your skin becomes reddened/irritated stop using the CHG.  Do not shave (including legs and underarms) for at least 48 hours prior to first CHG shower. It is OK to shave your face.  Please follow these instructions carefully.   1. Shower the NIGHT BEFORE SURGERY and the MORNING OF SURGERY with CHG Soap.   2. If you chose to wash your hair, wash your hair first as usual with your normal shampoo.  3. After you shampoo, rinse your hair and body thoroughly to remove the shampoo.  4. Use CHG as you would any other liquid soap. You can apply CHG directly to the skin and wash gently with a scrungie or a clean washcloth.   5. Apply the CHG Soap to your body ONLY FROM THE NECK DOWN.  Do not use on open wounds or open sores. Avoid contact with your eyes, ears, mouth and genitals (private parts). Wash Face and genitals (private parts)  with your normal soap.    6. Wash thoroughly, paying special attention to the area where your surgery will be performed.  7. Thoroughly rinse your body with warm water from the neck down.  8. DO NOT shower/wash with your normal soap after using and rinsing off the CHG Soap.  9. Pat yourself dry with a CLEAN TOWEL.  10. Wear CLEAN PAJAMAS to bed the night before surgery, wear comfortable clothes the morning of surgery  11. Place CLEAN SHEETS on your bed the night of your first shower and DO NOT SLEEP WITH PETS.    Day of Surgery:  Please shower the morning of surgery with the CHG soap Do not apply any deodorants/lotions.  Please wear clean clothes to the hospital/surgery  center.   Remember to brush your teeth WITH YOUR REGULAR TOOTHPASTE.   Please read over the following fact sheets that you were given.

## 2018-12-30 ENCOUNTER — Encounter (HOSPITAL_COMMUNITY): Payer: Self-pay

## 2018-12-30 ENCOUNTER — Other Ambulatory Visit: Payer: Self-pay

## 2018-12-30 ENCOUNTER — Encounter (HOSPITAL_COMMUNITY)
Admission: RE | Admit: 2018-12-30 | Discharge: 2018-12-30 | Disposition: A | Discharge status: Home / Self Care | Payer: Medicare Other | Source: Ambulatory Visit | Attending: General Surgery | Admitting: General Surgery

## 2018-12-30 DIAGNOSIS — E118 Type 2 diabetes mellitus with unspecified complications: Secondary | ICD-10-CM | POA: Insufficient documentation

## 2018-12-30 DIAGNOSIS — Z01818 Encounter for other preprocedural examination: Secondary | ICD-10-CM | POA: Diagnosis not present

## 2018-12-30 DIAGNOSIS — I10 Essential (primary) hypertension: Secondary | ICD-10-CM | POA: Insufficient documentation

## 2018-12-30 HISTORY — DX: Malignant (primary) neoplasm, unspecified: C80.1

## 2018-12-30 LAB — CBC
HCT: 40.4 % (ref 36.0–46.0)
Hemoglobin: 13.4 g/dL (ref 12.0–15.0)
MCH: 29.6 pg (ref 26.0–34.0)
MCHC: 33.2 g/dL (ref 30.0–36.0)
MCV: 89.2 fL (ref 80.0–100.0)
Platelets: 247 10*3/uL (ref 150–400)
RBC: 4.53 MIL/uL (ref 3.87–5.11)
RDW: 12.9 % (ref 11.5–15.5)
WBC: 6.1 10*3/uL (ref 4.0–10.5)
nRBC: 0 % (ref 0.0–0.2)

## 2018-12-30 LAB — BASIC METABOLIC PANEL
Anion gap: 9 (ref 5–15)
BUN: 15 mg/dL (ref 8–23)
CO2: 24 mmol/L (ref 22–32)
Calcium: 9.5 mg/dL (ref 8.9–10.3)
Chloride: 107 mmol/L (ref 98–111)
Creatinine, Ser: 0.87 mg/dL (ref 0.44–1.00)
GFR calc Af Amer: >60 mL/min (ref >60)
GFR calc non Af Amer: >60 mL/min (ref >60)
Glucose, Bld: 129 mg/dL — ABNORMAL HIGH (ref 70–99)
Potassium: 3.9 mmol/L (ref 3.5–5.1)
Sodium: 140 mmol/L (ref 135–145)

## 2018-12-30 LAB — HEMOGLOBIN A1C
Hgb A1c MFr Bld: 6.1 % — ABNORMAL HIGH (ref 4.8–5.6)
Mean Plasma Glucose: 128.37 mg/dL

## 2018-12-30 LAB — GLUCOSE, CAPILLARY: Glucose-Capillary: 123 mg/dL — ABNORMAL HIGH (ref 70–99)

## 2018-12-30 NOTE — Progress Notes (Signed)
PCP - [name], MD Cardiologist - denies  Chest x-ray - N/A EKG - 12/30/18 Stress Test - denies ECHO - denies Cardiac Cath - denies  Fasting Blood Sugar - 140? (patient states she thinks her meter is off) Checks Blood Sugar 2-3 times a day  Blood Thinner Instructions: N/A Aspirin Instructions: stopped 12/28/18 per surgeon instructions  COVID TEST- scheduled 12/31/18; patient is aware of need for self-quarantine and states verbal understanding  Coronavirus Screening  Have you experienced the following symptoms:  Cough yes/no: No Fever (>100.65F)  yes/no: No Runny nose yes/no: No Sore throat yes/no: No Difficulty breathing/shortness of breath  yes/no: No  Have you or a family member traveled in the last 14 days and where? yes/no: No  If the patient indicates "YES" to the above questions, their PAT will be rescheduled to limit the exposure to others and, the surgeon will be notified. THE PATIENT WILL NEED TO BE ASYMPTOMATIC FOR 14 DAYS.   If the patient is not experiencing any of these symptoms, the PAT nurse will instruct them to NOT bring anyone with them to their appointment since they may have these symptoms or traveled as well.   Please remind your patients and families that hospital visitation restrictions are in effect and the importance of the restrictions.   Anesthesia review: Yes; see documentation of complication of anesthesia in 1987 (in shadow chart); seed placement  Patient denies shortness of breath, fever, cough and chest pain at PAT appointment  All instructions explained to the patient, with a verbal understanding of the material. Patient agrees to go over the instructions while at home for a better understanding. Patient also instructed to self quarantine after being tested for COVID-19. The opportunity to ask questions was provided.

## 2018-12-31 ENCOUNTER — Other Ambulatory Visit (HOSPITAL_COMMUNITY)
Admission: RE | Admit: 2018-12-31 | Discharge: 2018-12-31 | Disposition: A | Discharge status: Home / Self Care | Payer: Medicare Other | Source: Ambulatory Visit | Attending: General Surgery | Admitting: General Surgery

## 2018-12-31 DIAGNOSIS — Z01812 Encounter for preprocedural laboratory examination: Secondary | ICD-10-CM | POA: Diagnosis not present

## 2018-12-31 DIAGNOSIS — Z20828 Contact with and (suspected) exposure to other viral communicable diseases: Secondary | ICD-10-CM | POA: Diagnosis not present

## 2018-12-31 NOTE — Anesthesia Preprocedure Evaluation (Addendum)
Anesthesia Evaluation  Patient identified by MRN, date of birth, ID band Patient awake    Reviewed: Allergy & Precautions, NPO status , Patient's Chart, lab work & pertinent test results  History of Anesthesia Complications Negative for: history of anesthetic complications  Airway Mallampati: III  TM Distance: >3 FB Neck ROM: Full    Dental  (+) Dental Advisory Given, Partial Upper, Chipped   Pulmonary neg pulmonary ROS,    Pulmonary exam normal        Cardiovascular hypertension, Pt. on medications (-) anginaNormal cardiovascular exam   B/l varicose veins    Neuro/Psych negative neurological ROS  negative psych ROS   GI/Hepatic Neg liver ROS, GERD  Medicated and Controlled,  Endo/Other  diabetes, Type 2, Oral Hypoglycemic Agents  Renal/GU negative Renal ROS     Musculoskeletal  (+) Arthritis ,   Abdominal   Peds  Hematology negative hematology ROS (+)   Anesthesia Other Findings   Reproductive/Obstetrics  Left breast sclerosing lesion                           Anesthesia Physical Anesthesia Plan  ASA: II  Anesthesia Plan: General   Post-op Pain Management:    Induction: Intravenous  PONV Risk Score and Plan: 3 and Treatment may vary due to age or medical condition, Ondansetron and Propofol infusion  Airway Management Planned: LMA  Additional Equipment: None  Intra-op Plan:   Post-operative Plan: Extubation in OR  Informed Consent: I have reviewed the patients History and Physical, chart, labs and discussed the procedure including the risks, benefits and alternatives for the proposed anesthesia with the patient or authorized representative who has indicated his/her understanding and acceptance.     Dental advisory given  Plan Discussed with: CRNA and Anesthesiologist  Anesthesia Plan Comments:       Anesthesia Quick Evaluation

## 2019-01-01 LAB — NOVEL CORONAVIRUS, NAA (HOSP ORDER, SEND-OUT TO REF LAB; TAT 18-24 HRS): SARS-CoV-2, NAA: NOT DETECTED

## 2019-01-02 NOTE — H&P (Signed)
Thomas Hoff Location: [facility] Patient #: [id number] DOB: 10-May-1930 Unknown / Language: Cleophus Molt / Race: White Female      History of Present Illness        This is a very pleasant 85 year old female. 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 She is referred by Dr. Hassan Rowan at the [facility] for complex sclerosing lesion left breast. [name] is her PCP. Livia Snellen was my chaperone.       Other than a breast cyst aspiration in the past, she has had no breast problems. Recent screening mammogram show focal distortion in the lateral left breast. Category C density. Axillary ultrasound negative. Biopsy shows complex sclerosing lesion, excision recommended     Past history non-insulin-dependent diabetes, GERD, hypertension, hyperlipidemia, cholecystectomy, appendectomy Family history negative for breast cancer or ovarian cancer. Mother and maternal grandmother had strokes Social history reveals that she is divorced and widowed. 4 children living. 2 children deceased. 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 [place]. Denies tobacco. Drinks alcohol occasionally. Has a friend that can take care of her postop      I discussed CSL with her. I quoted a 4-9% chance of upgrade to cancer of this high-risk lesion on excision. We talked about surgery and we talked about active observation. She would like to have this area excised.  I told her that I was retiring in January and so that if she wants me to do this I would like to proceed in the near future so that I can supervise her recovery. That is what she wants to do She'll be scheduled for left breast lumpectomy with radioactive seed localization. I discussed the indications, details, techniques, numerous risk of the surgery with her. She is aware of the risk of bleeding, infection, reoperation if cancer, cosmetic deformity, chronic pain. She understands these issues  well. All her questions are answered. She agrees with this plan.    Past Surgical History  Appendectomy  Breast Biopsy  Left. Knee Surgery  Right. Tonsillectomy   Diagnostic Studies History Mammogram  within last year Pap Smear  >5 years ago  Allergies Codeine/Codeine Derivatives  Lorcet *ANALGESICS - OPIOID*  Sulfamethoxazole *SULFONAMIDES*  Allergies Reconciled   Medication History Losartan Potassium (50MG  Tablet, Oral) Active. Pravastatin Sodium (80MG  Tablet, Oral) Active. hydroCHLOROthiazide (12.5MG  Capsule, Oral) Active. Aspirin (81MG  Tablet, Oral) Active. Vitamin D3 (25 MCG(1000 UT) Capsule, Oral) Active. metFORMIN HCl (850MG  Tablet, Oral) Active. Metanx (2.8-25-2MG  Tablet, Oral) Active. Medications Reconciled  Social History  No drug use  Tobacco use  Never smoker.  Family History Cerebrovascular Accident  Family Members In General, Mother. Hypertension  Mother.  Pregnancy / Birth History  Age at menarche  4 years. Age of menopause  [age] Length (months) of breastfeeding  3-6 Maternal age  58-25  Other Problems  Bladder Problems  Diabetes Mellitus  Gastroesophageal Reflux Disease  General anesthesia - complications  Lump In Breast     Review of Systems General Not Present- Appetite Loss, Chills, Fatigue, Fever, Night Sweats, Weight Gain and Weight Loss. Cardiovascular Present- Leg Cramps. Not Present- Chest Pain, Difficulty Breathing Lying Down, Palpitations, Rapid Heart Rate, Shortness of Breath and Swelling of Extremities. Musculoskeletal Present- Back Pain and Muscle Pain. Not Present- Joint Pain, Joint Stiffness, Muscle Weakness and Swelling of Extremities. Neurological Present- Trouble walking. Not Present- Decreased Memory, Fainting, Headaches, Numbness, Seizures, Tingling, Tremor and Weakness. Psychiatric Present- Change in Sleep Pattern. Not Present-  Anxiety, Bipolar, Depression, Fearful and Frequent  crying. Endocrine Not Present- Cold Intolerance, Excessive Hunger, Hair Changes, Heat Intolerance, Hot flashes and New Diabetes. Hematology Not Present- Blood Thinners, Easy Bruising, Excessive bleeding, Gland problems, HIV and Persistent Infections.  Vitals Weight: 160.2 lb Height: 63in Body Surface Area: 1.76 m Body Mass Index: 28.38 kg/m  Temp.: 98.56F  Pulse: 93 (Regular)  BP: 145/75 (Sitting, Left Arm, Standard)     Physical Exam General Mental Status-Alert. General Appearance-Consistent with stated age. Hydration-Well hydrated. Voice-Normal.  Head and Neck Head-normocephalic, atraumatic with no lesions or palpable masses. Trachea-midline. Thyroid Gland Characteristics - normal size and consistency.  Eye Eyeball - Bilateral-Extraocular movements intact. Sclera/Conjunctiva - Bilateral-No scleral icterus.  Chest and Lung Exam Chest and lung exam reveals -quiet, even and easy respiratory effort with no use of accessory muscles and on auscultation, normal breath sounds, no adventitious sounds and normal vocal resonance. Inspection Chest Wall - Normal. Back - normal.  Breast Note: Moderately large. A little bit tender lateral left breast. No other skin change or mass in either breast. No axillary adenopathy.   Cardiovascular Cardiovascular examination reveals -normal heart sounds, regular rate and rhythm with no murmurs and normal pedal pulses bilaterally.  Abdomen Inspection Inspection of the abdomen reveals - No Hernias. Skin - Scar - Note: Healed scars from appendectomy and cholecystectomy. Palpation/Percussion Palpation and Percussion of the abdomen reveal - Soft, Non Tender, No Rebound tenderness, No Rigidity (guarding) and No hepatosplenomegaly. Auscultation Auscultation of the abdomen reveals - Bowel sounds normal.  Neurologic Neurologic evaluation reveals -alert and oriented x 3 with no impairment of recent or remote  memory. Mental Status-Normal.  Musculoskeletal Normal Exam - Left-Upper Extremity Strength Normal and Lower Extremity Strength Normal. Normal Exam - Right-Upper Extremity Strength Normal and Lower Extremity Strength Normal.  Lymphatic Head & Neck  General Head & Neck Lymphatics: Bilateral - Description - Normal. Axillary  General Axillary Region: Bilateral - Description - Normal. Tenderness - Non Tender. Femoral & Inguinal  Generalized Femoral & Inguinal Lymphatics: Bilateral - Description - Normal. Tenderness - Non Tender.    Assessment & Plan ABNORMALITY OF LEFT BREAST ON SCREENING MAMMOGRAM (R92.8)   Your recent screening mammograms showed a focal area of distortion in the lateral left breast Image guided biopsy shows complex sclerosing lesion There is a 5-9% chance that this could upgrade to a early cancer on excision and, and excision is recommended Most likely you did not have cancer  you stated that you would like to go ahead and have this area excised to be sure, and I agree you will be scheduled for left breast lumpectomy with radioactive seed localization Dr. Dalbert Batman has discussed indications, techniques, and numerous risk of the surgery with you Dr. Dalbert Batman is advised you that he will be retiring in January, and would like to proceed with surgery in the near future so that he can supervise your recovery over the next few weeks.  TYPE 2 DIABETES MELLITUS TREATED WITHOUT INSULIN (E11.9) CHRONIC GERD (K21.9) HYPERTENSION, ESSENTIAL, BENIGN (I10)   Morgan Rennert M. Dalbert Batman, M.D., [facility] General and Minimally invasive Surgery Breast and Colorectal Surgery Office:   [phone]

## 2019-01-03 ENCOUNTER — Ambulatory Visit
Admission: RE | Admit: 2019-01-03 | Discharge: 2019-01-03 | Disposition: A | Discharge status: Home / Self Care | Payer: Medicare Other | Source: Ambulatory Visit | Attending: General Surgery | Admitting: General Surgery

## 2019-01-03 ENCOUNTER — Other Ambulatory Visit: Payer: Self-pay

## 2019-01-03 DIAGNOSIS — R928 Other abnormal and inconclusive findings on diagnostic imaging of breast: Secondary | ICD-10-CM | POA: Diagnosis not present

## 2019-01-04 ENCOUNTER — Encounter (HOSPITAL_COMMUNITY)
Admission: RE | Disposition: A | Discharge status: Home / Self Care | Payer: Self-pay | Source: Home / Self Care | Attending: General Surgery

## 2019-01-04 ENCOUNTER — Encounter (HOSPITAL_COMMUNITY): Payer: Self-pay

## 2019-01-04 ENCOUNTER — Ambulatory Visit (HOSPITAL_COMMUNITY): Payer: Medicare Other | Admitting: Certified Registered Nurse Anesthetist

## 2019-01-04 ENCOUNTER — Other Ambulatory Visit: Payer: Self-pay

## 2019-01-04 ENCOUNTER — Ambulatory Visit (HOSPITAL_COMMUNITY)
Admission: RE | Admit: 2019-01-04 | Discharge: 2019-01-04 | Disposition: A | Discharge status: Home / Self Care | Payer: Medicare Other | Attending: General Surgery | Admitting: General Surgery

## 2019-01-04 ENCOUNTER — Ambulatory Visit (HOSPITAL_COMMUNITY): Payer: Medicare Other | Admitting: Physician Assistant

## 2019-01-04 ENCOUNTER — Ambulatory Visit
Admission: RE | Admit: 2019-01-04 | Discharge: 2019-01-04 | Disposition: A | Discharge status: Home / Self Care | Payer: Medicare Other | Source: Ambulatory Visit | Attending: General Surgery | Admitting: General Surgery

## 2019-01-04 DIAGNOSIS — R928 Other abnormal and inconclusive findings on diagnostic imaging of breast: Secondary | ICD-10-CM

## 2019-01-04 DIAGNOSIS — K219 Gastro-esophageal reflux disease without esophagitis: Secondary | ICD-10-CM | POA: Diagnosis not present

## 2019-01-04 DIAGNOSIS — Z7984 Long term (current) use of oral hypoglycemic drugs: Secondary | ICD-10-CM | POA: Diagnosis not present

## 2019-01-04 DIAGNOSIS — Z79899 Other long term (current) drug therapy: Secondary | ICD-10-CM | POA: Insufficient documentation

## 2019-01-04 DIAGNOSIS — M1990 Unspecified osteoarthritis, unspecified site: Secondary | ICD-10-CM | POA: Insufficient documentation

## 2019-01-04 DIAGNOSIS — Z8249 Family history of ischemic heart disease and other diseases of the circulatory system: Secondary | ICD-10-CM | POA: Insufficient documentation

## 2019-01-04 DIAGNOSIS — I8393 Asymptomatic varicose veins of bilateral lower extremities: Secondary | ICD-10-CM | POA: Insufficient documentation

## 2019-01-04 DIAGNOSIS — N6489 Other specified disorders of breast: Secondary | ICD-10-CM | POA: Insufficient documentation

## 2019-01-04 DIAGNOSIS — Z7982 Long term (current) use of aspirin: Secondary | ICD-10-CM | POA: Diagnosis not present

## 2019-01-04 DIAGNOSIS — N6092 Unspecified benign mammary dysplasia of left breast: Secondary | ICD-10-CM | POA: Diagnosis not present

## 2019-01-04 DIAGNOSIS — I10 Essential (primary) hypertension: Secondary | ICD-10-CM | POA: Insufficient documentation

## 2019-01-04 DIAGNOSIS — E785 Hyperlipidemia, unspecified: Secondary | ICD-10-CM | POA: Insufficient documentation

## 2019-01-04 DIAGNOSIS — N6012 Diffuse cystic mastopathy of left breast: Secondary | ICD-10-CM | POA: Diagnosis not present

## 2019-01-04 DIAGNOSIS — E119 Type 2 diabetes mellitus without complications: Secondary | ICD-10-CM | POA: Insufficient documentation

## 2019-01-04 DIAGNOSIS — R921 Mammographic calcification found on diagnostic imaging of breast: Secondary | ICD-10-CM | POA: Insufficient documentation

## 2019-01-04 HISTORY — PX: BREAST LUMPECTOMY WITH RADIOACTIVE SEED LOCALIZATION: SHX6424

## 2019-01-04 HISTORY — DX: Other abnormal and inconclusive findings on diagnostic imaging of breast: R92.8

## 2019-01-04 LAB — GLUCOSE, CAPILLARY
Glucose-Capillary: 118 mg/dL — ABNORMAL HIGH (ref 70–99)
Glucose-Capillary: 157 mg/dL — ABNORMAL HIGH (ref 70–99)

## 2019-01-04 SURGERY — BREAST LUMPECTOMY WITH RADIOACTIVE SEED LOCALIZATION
Anesthesia: General | Site: Breast | Laterality: Left

## 2019-01-04 MED ORDER — METHYLENE BLUE 0.5 % INJ SOLN
INTRAVENOUS | Status: AC
  Filled 2019-01-04: qty 10

## 2019-01-04 MED ORDER — ACETAMINOPHEN 500 MG PO TABS
1000.0000 mg | ORAL_TABLET | ORAL | Status: AC
  Administered 2019-01-04: 08:00:00 1000 mg via ORAL
  Filled 2019-01-04: qty 2

## 2019-01-04 MED ORDER — FENTANYL CITRATE (PF) 250 MCG/5ML IJ SOLN
INTRAMUSCULAR | Status: AC
  Filled 2019-01-04: qty 5

## 2019-01-04 MED ORDER — LIDOCAINE 2% (20 MG/ML) 5 ML SYRINGE
INTRAMUSCULAR | Status: AC
  Filled 2019-01-04: qty 5

## 2019-01-04 MED ORDER — DEXAMETHASONE SODIUM PHOSPHATE 10 MG/ML IJ SOLN
INTRAMUSCULAR | Status: DC | PRN
  Administered 2019-01-04: 5 mg via INTRAVENOUS

## 2019-01-04 MED ORDER — BUPIVACAINE-EPINEPHRINE 0.5% -1:200000 IJ SOLN
INTRAMUSCULAR | Status: AC
  Filled 2019-01-04: qty 1

## 2019-01-04 MED ORDER — DEXAMETHASONE SODIUM PHOSPHATE 10 MG/ML IJ SOLN
INTRAMUSCULAR | Status: AC
  Filled 2019-01-04: qty 1

## 2019-01-04 MED ORDER — PHENYLEPHRINE 40 MCG/ML (10ML) SYRINGE FOR IV PUSH (FOR BLOOD PRESSURE SUPPORT)
PREFILLED_SYRINGE | INTRAVENOUS | Status: DC | PRN
  Administered 2019-01-04 (×2): 80 ug via INTRAVENOUS
  Administered 2019-01-04: 120 ug via INTRAVENOUS
  Administered 2019-01-04: 80 ug via INTRAVENOUS

## 2019-01-04 MED ORDER — PROPOFOL 1000 MG/100ML IV EMUL
INTRAVENOUS | Status: AC
  Filled 2019-01-04: qty 100

## 2019-01-04 MED ORDER — EPHEDRINE 5 MG/ML INJ
INTRAVENOUS | Status: AC
  Filled 2019-01-04: qty 10

## 2019-01-04 MED ORDER — LIDOCAINE HCL (CARDIAC) PF 100 MG/5ML IV SOSY
PREFILLED_SYRINGE | INTRAVENOUS | Status: DC | PRN
  Administered 2019-01-04 (×2): 40 mg via INTRAVENOUS

## 2019-01-04 MED ORDER — BUPIVACAINE-EPINEPHRINE 0.5% -1:200000 IJ SOLN
INTRAMUSCULAR | Status: DC | PRN
  Administered 2019-01-04: 50 mL

## 2019-01-04 MED ORDER — PROPOFOL 500 MG/50ML IV EMUL
INTRAVENOUS | Status: DC | PRN
  Administered 2019-01-04: 25 ug/kg/min via INTRAVENOUS

## 2019-01-04 MED ORDER — FENTANYL CITRATE (PF) 100 MCG/2ML IJ SOLN: 25.0000 ug | INTRAMUSCULAR | Status: DC | PRN

## 2019-01-04 MED ORDER — ONDANSETRON HCL 4 MG/2ML IJ SOLN: 4.0000 mg | Freq: Once | INTRAMUSCULAR | Status: DC | PRN

## 2019-01-04 MED ORDER — CHLORHEXIDINE GLUCONATE CLOTH 2 % EX PADS: 6.0000 | MEDICATED_PAD | Freq: Once | CUTANEOUS | Status: DC

## 2019-01-04 MED ORDER — LACTATED RINGERS IV SOLN
INTRAVENOUS | Status: DC
  Administered 2019-01-04: 08:00:00 via INTRAVENOUS

## 2019-01-04 MED ORDER — CEFAZOLIN SODIUM-DEXTROSE 2-4 GM/100ML-% IV SOLN
2.0000 g | INTRAVENOUS | Status: AC
  Administered 2019-01-04: 2 g via INTRAVENOUS
  Filled 2019-01-04: qty 100

## 2019-01-04 MED ORDER — GABAPENTIN 300 MG PO CAPS
300.0000 mg | ORAL_CAPSULE | ORAL | Status: AC
  Administered 2019-01-04: 300 mg via ORAL
  Filled 2019-01-04: qty 1

## 2019-01-04 MED ORDER — ONDANSETRON HCL 4 MG/2ML IJ SOLN
INTRAMUSCULAR | Status: AC
  Filled 2019-01-04: qty 2

## 2019-01-04 MED ORDER — HYDROCODONE-ACETAMINOPHEN 5-325 MG PO TABS: 1.0000 | ORAL_TABLET | Freq: Four times a day (QID) | ORAL | 0 refills | Status: DC | PRN

## 2019-01-04 MED ORDER — ONDANSETRON HCL 4 MG/2ML IJ SOLN
INTRAMUSCULAR | Status: DC | PRN
  Administered 2019-01-04: 4 mg via INTRAVENOUS

## 2019-01-04 MED ORDER — FENTANYL CITRATE (PF) 250 MCG/5ML IJ SOLN
INTRAMUSCULAR | Status: DC | PRN
  Administered 2019-01-04 (×2): 50 ug via INTRAVENOUS

## 2019-01-04 MED ORDER — 0.9 % SODIUM CHLORIDE (POUR BTL) OPTIME
TOPICAL | Status: DC | PRN
  Administered 2019-01-04: 1000 mL

## 2019-01-04 MED ORDER — PHENYLEPHRINE 40 MCG/ML (10ML) SYRINGE FOR IV PUSH (FOR BLOOD PRESSURE SUPPORT)
PREFILLED_SYRINGE | INTRAVENOUS | Status: AC
  Filled 2019-01-04: qty 10

## 2019-01-04 MED ORDER — PROPOFOL 10 MG/ML IV BOLUS
INTRAVENOUS | Status: DC | PRN
  Administered 2019-01-04: 100 mg via INTRAVENOUS

## 2019-01-04 MED ORDER — EPHEDRINE SULFATE-NACL 50-0.9 MG/10ML-% IV SOSY
PREFILLED_SYRINGE | INTRAVENOUS | Status: DC | PRN
  Administered 2019-01-04: 7.5 mg via INTRAVENOUS
  Administered 2019-01-04: 5 mg via INTRAVENOUS
  Administered 2019-01-04: 7.5 mg via INTRAVENOUS
  Administered 2019-01-04: 5 mg via INTRAVENOUS
  Administered 2019-01-04: 7.5 mg via INTRAVENOUS

## 2019-01-04 MED ORDER — SODIUM CHLORIDE 0.9% FLUSH: 3.0000 mL | Freq: Two times a day (BID) | INTRAVENOUS | Status: DC

## 2019-01-04 SURGICAL SUPPLY — 43 items
ADH SKN CLS APL DERMABOND .7 (GAUZE/BANDAGES/DRESSINGS) ×1
APL PRP STRL LF DISP 70% ISPRP (MISCELLANEOUS) ×1
APPLIER CLIP 9.375 MED OPEN (MISCELLANEOUS) ×2
APR CLP MED 9.3 20 MLT OPN (MISCELLANEOUS) ×1
BINDER BREAST LRG (GAUZE/BANDAGES/DRESSINGS) ×2 IMPLANT
BINDER BREAST XLRG (GAUZE/BANDAGES/DRESSINGS) IMPLANT
CANISTER SUCT 3000ML PPV (MISCELLANEOUS) ×2 IMPLANT
CHLORAPREP W/TINT 26 (MISCELLANEOUS) ×2 IMPLANT
CLIP APPLIE 9.375 MED OPEN (MISCELLANEOUS) ×1 IMPLANT
COVER PROBE W GEL 5X96 (DRAPES) ×2 IMPLANT
COVER SURGICAL LIGHT HANDLE (MISCELLANEOUS) ×2 IMPLANT
COVER WAND RF STERILE (DRAPES) ×2 IMPLANT
DERMABOND ADVANCED (GAUZE/BANDAGES/DRESSINGS) ×1
DERMABOND ADVANCED .7 DNX12 (GAUZE/BANDAGES/DRESSINGS) ×1 IMPLANT
DEVICE DUBIN SPECIMEN MAMMOGRA (MISCELLANEOUS) ×2 IMPLANT
DRAPE CHEST BREAST 15X10 FENES (DRAPES) ×2 IMPLANT
DRSG PAD ABDOMINAL 8X10 ST (GAUZE/BANDAGES/DRESSINGS) ×2 IMPLANT
ELECT CAUTERY BLADE 6.4 (BLADE) ×2 IMPLANT
ELECT REM PT RETURN 9FT ADLT (ELECTROSURGICAL) ×2
ELECTRODE REM PT RTRN 9FT ADLT (ELECTROSURGICAL) ×1 IMPLANT
GAUZE SPONGE 4X4 12PLY STRL LF (GAUZE/BANDAGES/DRESSINGS) ×2 IMPLANT
GLOVE SS BIOGEL STRL SZ 7 (GLOVE) ×2 IMPLANT
GLOVE SUPERSENSE BIOGEL SZ 7 (GLOVE) ×2
GOWN STRL REUS W/ TWL LRG LVL3 (GOWN DISPOSABLE) ×1 IMPLANT
GOWN STRL REUS W/ TWL XL LVL3 (GOWN DISPOSABLE) ×1 IMPLANT
GOWN STRL REUS W/TWL LRG LVL3 (GOWN DISPOSABLE) ×2
GOWN STRL REUS W/TWL XL LVL3 (GOWN DISPOSABLE) ×2
ILLUMINATOR WAVEGUIDE N/F (MISCELLANEOUS) IMPLANT
KIT BASIN OR (CUSTOM PROCEDURE TRAY) ×2 IMPLANT
KIT MARKER MARGIN INK (KITS) ×2 IMPLANT
LIGHT WAVEGUIDE WIDE FLAT (MISCELLANEOUS) IMPLANT
NDL HYPO 25GX1X1/2 BEV (NEEDLE) ×1 IMPLANT
NEEDLE HYPO 25GX1X1/2 BEV (NEEDLE) ×2 IMPLANT
NS IRRIG 1000ML POUR BTL (IV SOLUTION) ×2 IMPLANT
PACK GENERAL/GYN (CUSTOM PROCEDURE TRAY) ×2 IMPLANT
PAD ABD 8X10 STRL (GAUZE/BANDAGES/DRESSINGS) ×1 IMPLANT
SPONGE LAP 4X18 RFD (DISPOSABLE) ×2 IMPLANT
SUT MNCRL AB 4-0 PS2 18 (SUTURE) ×2 IMPLANT
SUT SILK 2 0 SH (SUTURE) ×2 IMPLANT
SUT VIC AB 3-0 SH 18 (SUTURE) ×2 IMPLANT
SYR CONTROL 10ML LL (SYRINGE) ×2 IMPLANT
TOWEL GREEN STERILE (TOWEL DISPOSABLE) ×2 IMPLANT
TOWEL GREEN STERILE FF (TOWEL DISPOSABLE) ×2 IMPLANT

## 2019-01-04 NOTE — Transfer of Care (Signed)
Immediate Anesthesia Transfer of Care Note  Patient: Melissa Glenn  Procedure(s) Performed: LEFT BREAST LUMPECTOMY WITH RADIOACTIVE SEED LOCALIZATION (Left Breast)  Patient Location: PACU  Anesthesia Type:General  Level of Consciousness: drowsy and patient cooperative  Airway & Oxygen Therapy: Patient Spontanous Breathing and Patient connected to face mask oxygen  Post-op Assessment: Report given to RN and Post -op Vital signs reviewed and stable  Post vital signs: Reviewed and stable  Last Vitals:  Vitals Value Taken Time  BP 131/54 01/04/19 1026  Temp 36.4 C 01/04/19 1026  Pulse 84 01/04/19 1028  Resp 14 01/04/19 1028  SpO2 99 % 01/04/19 1028  Vitals shown include unvalidated device data.  Last Pain:  Vitals:   01/04/19 1026  TempSrc:   PainSc: (P) Asleep         Complications: No apparent anesthesia complications

## 2019-01-04 NOTE — Discharge Instructions (Signed)
[facility] °Office Phone Number [phone] ° °BREAST BIOPSY/ PARTIAL MASTECTOMY: POST OP INSTRUCTIONS ° °Always review your discharge instruction sheet given to you by the facility where your surgery was performed. ° °IF YOU HAVE DISABILITY OR FAMILY LEAVE FORMS, YOU MUST BRING THEM TO THE OFFICE FOR PROCESSING.  DO NOT GIVE THEM TO YOUR DOCTOR. ° °1. A prescription for pain medication may be given to you upon discharge.  Take your pain medication as prescribed, if needed.  If narcotic pain medicine is not needed, then you may take acetaminophen (Tylenol) or ibuprofen (Advil) as needed. °2. Take your usually prescribed medications unless otherwise directed °3. If you need a refill on your pain medication, please contact your pharmacy.  They will contact our office to request authorization.  Prescriptions will not be filled after 5pm or on week-ends. °4. You should eat very light the first 24 hours after surgery, such as soup, crackers, pudding, etc.  Resume your normal diet the day after surgery. °5. Most patients will experience some swelling and bruising in the breast.  Ice packs and a good support bra will help.  Swelling and bruising can take several days to resolve.  °6. It is common to experience some constipation if taking pain medication after surgery.  Increasing fluid intake and taking a stool softener will usually help or prevent this problem from occurring.  A mild laxative (Milk of Magnesia or Miralax) should be taken according to package directions if there are no bowel movements after 48 hours. °7. Unless discharge instructions indicate otherwise, you may remove your bandages 24-48 hours after surgery, and you may shower at that time.  You may have steri-strips (small skin tapes) in place directly over the incision.  These strips should be left on the skin for 7-10 days.  If your surgeon used skin glue on the incision, you may shower in 24 hours.  The glue will flake off over the  next 2-3 weeks.  Any sutures or staples will be removed at the office during your follow-up visit. °8. ACTIVITIES:  You may resume regular daily activities (gradually increasing) beginning the next day.  Wearing a good support bra or sports bra minimizes pain and swelling.  You may have sexual intercourse when it is comfortable. °a. You may drive when you no longer are taking prescription pain medication, you can comfortably wear a seatbelt, and you can safely maneuver your car and apply brakes. °b. RETURN TO WORK:  ______________________________________________________________________________________ °9. You should see your doctor in the office for a follow-up appointment approximately two weeks after your surgery.  Your doctor’s nurse will typically make your follow-up appointment when she calls you with your pathology report.  Expect your pathology report 2-3 business days after your surgery.  You may call to check if you do not hear from us after three days. °10. OTHER INSTRUCTIONS: _______________________________________________________________________________________________ _____________________________________________________________________________________________________________________________________ °_____________________________________________________________________________________________________________________________________ °_____________________________________________________________________________________________________________________________________ ° °WHEN TO CALL YOUR DOCTOR: °1. Fever over 101.0 °2. Nausea and/or vomiting. °3. Extreme swelling or bruising. °4. Continued bleeding from incision. °5. Increased pain, redness, or drainage from the incision. ° °The clinic staff is available to answer your questions during regular business hours.  Please don’t hesitate to call and ask to speak to one of the nurses for clinical concerns.  If you have a medical emergency, go to the nearest  emergency room or call 911.  A surgeon from [facility] is always on call at the hospital. ° °For further questions, please visit [email or web address]  ° ° ° ° ° ° ° ° ° ° °••••••••• ° ° °  Managing Your Pain After Surgery Without Opioids ° ° ° °Thank you for participating in our program to help patients manage their pain after surgery without opioids. This is part of our effort to provide you with the best care possible, without exposing you or your family to the risk that opioids pose. ° °What pain can I expect after surgery? °You can expect to have some pain after surgery. This is normal. The pain is typically worse the day after surgery, and quickly begins to get better. °Many studies have found that many patients are able to manage their pain after surgery with Over-the-Counter (OTC) medications such as Tylenol and Motrin. If you have a condition that does not allow you to take Tylenol or Motrin, notify your surgical team. ° °How will I manage my pain? °The best strategy for controlling your pain after surgery is around the clock pain control with Tylenol (acetaminophen) and Motrin (ibuprofen or Advil). Alternating these medications with each other allows you to maximize your pain control. In addition to Tylenol and Motrin, you can use heating pads or ice packs on your incisions to help reduce your pain. ° °How will I alternate your regular strength over-the-counter pain medication? °You will take a dose of pain medication every three hours. °; Start by taking 650 mg of Tylenol (2 pills of 325 mg) °; 3 hours later take 600 mg of Motrin (3 pills of 200 mg) °; 3 hours after taking the Motrin take 650 mg of Tylenol °; 3 hours after that take 600 mg of Motrin. ° ° °- 1 - ° °See example - if your first dose of Tylenol is at 12:00 PM ° ° °12:00 PM Tylenol 650 mg (2 pills of 325 mg)  °3:00 PM Motrin 600 mg (3 pills of 200 mg)  °6:00 PM Tylenol 650 mg (2 pills of 325 mg)  °9:00 PM Motrin 600 mg (3  pills of 200 mg)  °Continue alternating every 3 hours  ° °We recommend that you follow this schedule around-the-clock for at least 3 days after surgery, or until you feel that it is no longer needed. Use the table on the last page of this handout to keep track of the medications you are taking. °Important: °Do not take more than 3000mg of Tylenol or 3200mg of Motrin in a 24-hour period. °Do not take ibuprofen/Motrin if you have a history of bleeding stomach ulcers, severe kidney disease, &/or actively taking a blood thinner ° °What if I still have pain? °If you have pain that is not controlled with the over-the-counter pain medications (Tylenol and Motrin or Advil) you might have what we call “breakthrough” pain. You will receive a prescription for a small amount of an opioid pain medication such as Oxycodone, Tramadol, or Tylenol with Codeine. Use these opioid pills in the first 24 hours after surgery if you have breakthrough pain. Do not take more than 1 pill every 4-6 hours. ° °If you still have uncontrolled pain after using all opioid pills, don't hesitate to call our staff using the number provided. We will help make sure you are managing your pain in the best way possible, and if necessary, we can provide a prescription for additional pain medication. ° ° °Day 1   ° °Time  °Name of Medication Number of pills taken  °Amount of Acetaminophen  °Pain Level  ° °Comments  °AM PM       °AM PM       °AM PM       °  AM PM       °AM PM       °AM PM       °AM PM       °AM PM       °Total Daily amount of Acetaminophen °Do not take more than  3,000 mg per day    ° ° °Day 2   ° °Time  °Name of Medication Number of pills °taken  °Amount of Acetaminophen  °Pain Level  ° °Comments  °AM PM       °AM PM       °AM PM       °AM PM       °AM PM       °AM PM       °AM PM       °AM PM       °Total Daily amount of Acetaminophen °Do not take more than  3,000 mg per day    ° ° °Day 3   ° °Time  °Name of Medication Number of pills taken    °Amount of Acetaminophen  °Pain Level  ° °Comments  °AM PM       °AM PM       °AM PM       °AM PM       ° ° ° °AM PM       °AM PM       °AM PM       °AM PM       °Total Daily amount of Acetaminophen °Do not take more than  3,000 mg per day    ° ° °Day 4   ° °Time  °Name of Medication Number of pills taken  °Amount of Acetaminophen  °Pain Level  ° °Comments  °AM PM       °AM PM       °AM PM       °AM PM       °AM PM       °AM PM       °AM PM       °AM PM       °Total Daily amount of Acetaminophen °Do not take more than  3,000 mg per day    ° ° °Day 5   ° °Time  °Name of Medication Number °of pills taken  °Amount of Acetaminophen  °Pain Level  ° °Comments  °AM PM       °AM PM       °AM PM       °AM PM       °AM PM       °AM PM       °AM PM       °AM PM       °Total Daily amount of Acetaminophen °Do not take more than  3,000 mg per day    ° ° ° °Day 6   ° °Time  °Name of Medication Number of pills °taken  °Amount of Acetaminophen  °Pain Level  °Comments  °AM PM       °AM PM       °AM PM       °AM PM       °AM PM       °AM PM       °AM PM       °AM PM       °Total Daily amount of Acetaminophen °Do not take more than    3,000 mg per day    ° ° °Day 7   ° °Time  °Name of Medication Number of pills taken  °Amount of Acetaminophen  °Pain Level  ° °Comments  °AM PM       °AM PM       °AM PM       °AM PM       °AM PM       °AM PM       °AM PM       °AM PM       °Total Daily amount of Acetaminophen °Do not take more than  3,000 mg per day    ° ° ° ° °For additional information about how and where to safely dispose of unused opioid °medications - https://www.morepowerfulnc.org ° °Disclaimer: This document contains information and/or instructional materials adapted from [facility] for the typical patient with your condition. It does not replace medical advice from your health care provider because your experience may differ from that of the °typical patient. Talk to your health care provider if you have any questions about  this °document, your condition or your treatment plan. °Adapted from [facility] ° °

## 2019-01-04 NOTE — Anesthesia Postprocedure Evaluation (Signed)
Anesthesia Post Note  Patient: Synovia Ramakrishnan  Procedure(s) Performed: LEFT BREAST LUMPECTOMY WITH RADIOACTIVE SEED LOCALIZATION (Left Breast)     Patient location during evaluation: PACU Anesthesia Type: General Level of consciousness: awake and alert Pain management: pain level controlled Vital Signs Assessment: post-procedure vital signs reviewed and stable Respiratory status: spontaneous breathing, nonlabored ventilation and respiratory function stable Cardiovascular status: blood pressure returned to baseline and stable Postop Assessment: no apparent nausea or vomiting Anesthetic complications: no    Last Vitals:  Vitals:   01/04/19 1026 01/04/19 1041  BP: (!) 131/54 (!) 127/48  Pulse: 86 85  Resp: 20 19  Temp: 36.4 C 36.5 C  SpO2: 99% 93%    Last Pain:  Vitals:   01/04/19 1041  TempSrc:   PainSc: 0-No pain                 Audry Pili

## 2019-01-04 NOTE — Anesthesia Procedure Notes (Signed)
Procedure Name: LMA Insertion Date/Time: 01/04/2019 9:22 AM Performed by: Raenette Rover, CRNA Pre-anesthesia Checklist: Patient identified, Emergency Drugs available, Suction available and Patient being monitored Patient Re-evaluated:Patient Re-evaluated prior to induction Oxygen Delivery Method: Circle system utilized Preoxygenation: Pre-oxygenation with 100% oxygen Induction Type: IV induction LMA: LMA inserted LMA Size: 4.0 Number of attempts: 1 Placement Confirmation: positive ETCO2 and breath sounds checked- equal and bilateral Tube secured with: Tape Dental Injury: Teeth and Oropharynx as per pre-operative assessment

## 2019-01-04 NOTE — Op Note (Signed)
Patient Name:           Melissa Glenn   Date of Surgery:        01/04/2019  Pre op Diagnosis:      Complex sclerosing lesion left breast  Post op Diagnosis:    Same  Procedure:                 Left breast lumpectomy with radioactive seed localization  Surgeon:                     Edsel Petrin. Dalbert Batman, M.D., FACS  Assistant:                      OR staff  Operative Indications:   This is a very pleasant 85 year old female who is brought to the operating room electively for left breast lumpectomy for complex sclerosing lesion.[name] is her PCP. Marland Kitchen       Other than a breast cyst aspiration in the past, she has had no breast problems. Recent screening mammogram show focal distortion in the lateral left breast. Category C density. Axillary ultrasound negative. Biopsy shows complex sclerosing lesion, excision recommended      I discussed CSL with her. I quoted a 4-9% chance of upgrade to cancer of this high-risk lesion on excision. We talked about surgery and we talked about active observation. She would like to have this area excised.  She'll be scheduled for left breast lumpectomy with radioactive seed localization. She agrees with this plan.  Operative Findings:       The original titanium marker clip and radioactive seed were very close to 1 another in the lateral left breast.  A very laterally placed circumareolar curvilinear incision was made.  The specimen mammogram looked very good containing the seed and the marker clip in the relative center of the specimen.  Procedure in Detail:          Following the induction of general LMA anesthesia the patient's left breast was prepped and draped in a sterile fashion.  Surgical timeout was performed.  Intravenous antibiotics were given.  0.5% Marcaine with epinephrine was used as a local infiltration anesthetic.     Using the neoprobe I isolated the radioactive signal in the far lateral left breast.  I drew and planned a curvilinear  incision at the 3 o'clock position.  The incision was made with a knife.  The lumpectomy was performed with the neoprobe and electrocautery.  The specimen was removed and marked with silk sutures and a 6 color ink kit.  The specimen mammogram looked very good as described above.  Specimen was sent to the lab where the seed was retrieved.  Hemostasis was excellent.  The wound was irrigated.  I placed 5 metal marker clips in the walls of the lumpectomy cavity.  The breast tissues were closed in several layers with interrupted 3-0 Vicryl and the skin closed with subcuticular 4-0 Monocryl and Dermabond.  Dry bandages and a breast binder were placed.  The patient tolerated the procedure well and was taken to PACU in stable condition.  EBL 20 cc or less.  Counts correct.  Complications none.    Addendum: I logged onto the PMP aware website and reviewed her prescription medication history     Donelle Hise M. Dalbert Batman, M.D., FACS General and Minimally Invasive Surgery Breast and Colorectal Surgery  01/04/2019 10:18 AM

## 2019-01-04 NOTE — Interval H&P Note (Signed)
History and Physical Interval Note:  01/04/2019 8:45 AM  Melissa Glenn  has presented today for surgery, with the diagnosis of LEFT BREAST COMPLEX SCLEROSING LESION.  The various methods of treatment have been discussed with the patient and family. After consideration of risks, benefits and other options for treatment, the patient has consented to  Procedure(s): LEFT BREAST LUMPECTOMY WITH RADIOACTIVE SEED LOCALIZATION (Left) as a surgical intervention.  The patient's history has been reviewed, patient examined, no change in status, stable for surgery.  I have reviewed the patient's chart and labs.  Questions were answered to the patient's satisfaction.     Adin Hector

## 2019-01-05 ENCOUNTER — Encounter (HOSPITAL_COMMUNITY): Payer: Self-pay | Admitting: General Surgery

## 2019-01-06 LAB — SURGICAL PATHOLOGY

## 2019-02-01 DIAGNOSIS — E1165 Type 2 diabetes mellitus with hyperglycemia: Secondary | ICD-10-CM | POA: Diagnosis not present

## 2019-02-01 DIAGNOSIS — I10 Essential (primary) hypertension: Secondary | ICD-10-CM | POA: Diagnosis not present

## 2019-02-08 DIAGNOSIS — E1165 Type 2 diabetes mellitus with hyperglycemia: Secondary | ICD-10-CM | POA: Diagnosis not present

## 2019-02-08 DIAGNOSIS — I10 Essential (primary) hypertension: Secondary | ICD-10-CM | POA: Diagnosis not present

## 2019-08-15 ENCOUNTER — Other Ambulatory Visit: Payer: Self-pay | Admitting: Internal Medicine

## 2019-08-15 DIAGNOSIS — Z1231 Encounter for screening mammogram for malignant neoplasm of breast: Secondary | ICD-10-CM

## 2019-10-13 DIAGNOSIS — I10 Essential (primary) hypertension: Secondary | ICD-10-CM | POA: Diagnosis not present

## 2019-10-13 DIAGNOSIS — E1165 Type 2 diabetes mellitus with hyperglycemia: Secondary | ICD-10-CM | POA: Diagnosis not present

## 2019-11-21 ENCOUNTER — Ambulatory Visit
Admission: RE | Admit: 2019-11-21 | Discharge: 2019-11-21 | Disposition: A | Discharge status: Home / Self Care | Payer: Medicare Other | Source: Ambulatory Visit | Attending: Internal Medicine | Admitting: Internal Medicine

## 2019-11-21 ENCOUNTER — Other Ambulatory Visit: Payer: Self-pay

## 2019-11-21 DIAGNOSIS — Z1231 Encounter for screening mammogram for malignant neoplasm of breast: Secondary | ICD-10-CM

## 2019-11-22 ENCOUNTER — Other Ambulatory Visit: Payer: Self-pay | Admitting: Internal Medicine

## 2019-11-22 DIAGNOSIS — N6459 Other signs and symptoms in breast: Secondary | ICD-10-CM

## 2019-12-08 ENCOUNTER — Other Ambulatory Visit: Payer: Self-pay

## 2019-12-08 ENCOUNTER — Ambulatory Visit
Admission: RE | Admit: 2019-12-08 | Discharge: 2019-12-08 | Disposition: A | Discharge status: Home / Self Care | Payer: Medicare Other | Source: Ambulatory Visit | Attending: Internal Medicine | Admitting: Internal Medicine

## 2019-12-08 DIAGNOSIS — N6459 Other signs and symptoms in breast: Secondary | ICD-10-CM

## 2019-12-08 DIAGNOSIS — R922 Inconclusive mammogram: Secondary | ICD-10-CM | POA: Diagnosis not present

## 2019-12-08 DIAGNOSIS — N6012 Diffuse cystic mastopathy of left breast: Secondary | ICD-10-CM | POA: Diagnosis not present

## 2019-12-22 DIAGNOSIS — L602 Onychogryphosis: Secondary | ICD-10-CM | POA: Diagnosis not present

## 2019-12-22 DIAGNOSIS — M21611 Bunion of right foot: Secondary | ICD-10-CM | POA: Diagnosis not present

## 2019-12-22 DIAGNOSIS — L84 Corns and callosities: Secondary | ICD-10-CM | POA: Diagnosis not present

## 2019-12-22 DIAGNOSIS — E1351 Other specified diabetes mellitus with diabetic peripheral angiopathy without gangrene: Secondary | ICD-10-CM | POA: Diagnosis not present

## 2019-12-22 DIAGNOSIS — M21612 Bunion of left foot: Secondary | ICD-10-CM | POA: Diagnosis not present

## 2019-12-22 DIAGNOSIS — M2042 Other hammer toe(s) (acquired), left foot: Secondary | ICD-10-CM | POA: Diagnosis not present

## 2020-02-21 DIAGNOSIS — Z135 Encounter for screening for eye and ear disorders: Secondary | ICD-10-CM | POA: Diagnosis not present

## 2020-02-21 DIAGNOSIS — Z961 Presence of intraocular lens: Secondary | ICD-10-CM | POA: Diagnosis not present

## 2020-02-21 DIAGNOSIS — E119 Type 2 diabetes mellitus without complications: Secondary | ICD-10-CM | POA: Diagnosis not present

## 2020-02-22 DIAGNOSIS — E1165 Type 2 diabetes mellitus with hyperglycemia: Secondary | ICD-10-CM | POA: Diagnosis not present

## 2020-02-22 DIAGNOSIS — E119 Type 2 diabetes mellitus without complications: Secondary | ICD-10-CM | POA: Diagnosis not present

## 2020-02-22 DIAGNOSIS — I10 Essential (primary) hypertension: Secondary | ICD-10-CM | POA: Diagnosis not present

## 2020-02-22 DIAGNOSIS — Z79899 Other long term (current) drug therapy: Secondary | ICD-10-CM | POA: Diagnosis not present

## 2020-02-22 DIAGNOSIS — M8580 Other specified disorders of bone density and structure, unspecified site: Secondary | ICD-10-CM | POA: Diagnosis not present

## 2020-02-29 DIAGNOSIS — E559 Vitamin D deficiency, unspecified: Secondary | ICD-10-CM | POA: Diagnosis not present

## 2020-02-29 DIAGNOSIS — I10 Essential (primary) hypertension: Secondary | ICD-10-CM | POA: Diagnosis not present

## 2020-02-29 DIAGNOSIS — Z0000 Encounter for general adult medical examination without abnormal findings: Secondary | ICD-10-CM | POA: Diagnosis not present

## 2020-02-29 DIAGNOSIS — E1140 Type 2 diabetes mellitus with diabetic neuropathy, unspecified: Secondary | ICD-10-CM | POA: Diagnosis not present

## 2020-02-29 DIAGNOSIS — E1121 Type 2 diabetes mellitus with diabetic nephropathy: Secondary | ICD-10-CM | POA: Diagnosis not present

## 2020-03-07 DIAGNOSIS — E1351 Other specified diabetes mellitus with diabetic peripheral angiopathy without gangrene: Secondary | ICD-10-CM | POA: Diagnosis not present

## 2020-03-07 DIAGNOSIS — L602 Onychogryphosis: Secondary | ICD-10-CM | POA: Diagnosis not present

## 2020-03-07 DIAGNOSIS — L84 Corns and callosities: Secondary | ICD-10-CM | POA: Diagnosis not present

## 2020-04-30 DIAGNOSIS — E1121 Type 2 diabetes mellitus with diabetic nephropathy: Secondary | ICD-10-CM | POA: Diagnosis not present

## 2020-04-30 DIAGNOSIS — I10 Essential (primary) hypertension: Secondary | ICD-10-CM | POA: Diagnosis not present

## 2020-04-30 DIAGNOSIS — M8580 Other specified disorders of bone density and structure, unspecified site: Secondary | ICD-10-CM | POA: Diagnosis not present

## 2020-04-30 DIAGNOSIS — E785 Hyperlipidemia, unspecified: Secondary | ICD-10-CM | POA: Diagnosis not present

## 2020-05-31 DIAGNOSIS — M8580 Other specified disorders of bone density and structure, unspecified site: Secondary | ICD-10-CM | POA: Diagnosis not present

## 2020-05-31 DIAGNOSIS — I10 Essential (primary) hypertension: Secondary | ICD-10-CM | POA: Diagnosis not present

## 2020-05-31 DIAGNOSIS — E1121 Type 2 diabetes mellitus with diabetic nephropathy: Secondary | ICD-10-CM | POA: Diagnosis not present

## 2020-05-31 DIAGNOSIS — E785 Hyperlipidemia, unspecified: Secondary | ICD-10-CM | POA: Diagnosis not present

## 2020-06-27 DIAGNOSIS — E785 Hyperlipidemia, unspecified: Secondary | ICD-10-CM | POA: Diagnosis not present

## 2020-06-27 DIAGNOSIS — M810 Age-related osteoporosis without current pathological fracture: Secondary | ICD-10-CM | POA: Diagnosis not present

## 2020-06-27 DIAGNOSIS — E1140 Type 2 diabetes mellitus with diabetic neuropathy, unspecified: Secondary | ICD-10-CM | POA: Diagnosis not present

## 2020-06-27 DIAGNOSIS — I10 Essential (primary) hypertension: Secondary | ICD-10-CM | POA: Diagnosis not present

## 2020-07-11 DIAGNOSIS — L603 Nail dystrophy: Secondary | ICD-10-CM | POA: Diagnosis not present

## 2020-07-11 DIAGNOSIS — I739 Peripheral vascular disease, unspecified: Secondary | ICD-10-CM | POA: Diagnosis not present

## 2020-07-11 DIAGNOSIS — L84 Corns and callosities: Secondary | ICD-10-CM | POA: Diagnosis not present

## 2020-07-11 DIAGNOSIS — E1151 Type 2 diabetes mellitus with diabetic peripheral angiopathy without gangrene: Secondary | ICD-10-CM | POA: Diagnosis not present

## 2020-07-25 DIAGNOSIS — Z9119 Patient's noncompliance with other medical treatment and regimen: Secondary | ICD-10-CM | POA: Diagnosis not present

## 2020-07-26 DIAGNOSIS — H6120 Impacted cerumen, unspecified ear: Secondary | ICD-10-CM | POA: Diagnosis not present

## 2020-07-31 DIAGNOSIS — E1121 Type 2 diabetes mellitus with diabetic nephropathy: Secondary | ICD-10-CM | POA: Diagnosis not present

## 2020-07-31 DIAGNOSIS — E785 Hyperlipidemia, unspecified: Secondary | ICD-10-CM | POA: Diagnosis not present

## 2020-07-31 DIAGNOSIS — M8580 Other specified disorders of bone density and structure, unspecified site: Secondary | ICD-10-CM | POA: Diagnosis not present

## 2020-07-31 DIAGNOSIS — I10 Essential (primary) hypertension: Secondary | ICD-10-CM | POA: Diagnosis not present

## 2020-09-30 DIAGNOSIS — E1121 Type 2 diabetes mellitus with diabetic nephropathy: Secondary | ICD-10-CM | POA: Diagnosis not present

## 2020-09-30 DIAGNOSIS — M8580 Other specified disorders of bone density and structure, unspecified site: Secondary | ICD-10-CM | POA: Diagnosis not present

## 2020-09-30 DIAGNOSIS — I10 Essential (primary) hypertension: Secondary | ICD-10-CM | POA: Diagnosis not present

## 2020-09-30 DIAGNOSIS — E785 Hyperlipidemia, unspecified: Secondary | ICD-10-CM | POA: Diagnosis not present

## 2020-10-10 DIAGNOSIS — L603 Nail dystrophy: Secondary | ICD-10-CM | POA: Diagnosis not present

## 2020-10-10 DIAGNOSIS — L84 Corns and callosities: Secondary | ICD-10-CM | POA: Diagnosis not present

## 2020-10-10 DIAGNOSIS — E1151 Type 2 diabetes mellitus with diabetic peripheral angiopathy without gangrene: Secondary | ICD-10-CM | POA: Diagnosis not present

## 2020-10-10 DIAGNOSIS — I739 Peripheral vascular disease, unspecified: Secondary | ICD-10-CM | POA: Diagnosis not present

## 2020-10-22 DIAGNOSIS — H5213 Myopia, bilateral: Secondary | ICD-10-CM | POA: Diagnosis not present

## 2020-10-24 ENCOUNTER — Other Ambulatory Visit: Payer: Self-pay | Admitting: Internal Medicine

## 2020-10-24 DIAGNOSIS — Z1231 Encounter for screening mammogram for malignant neoplasm of breast: Secondary | ICD-10-CM

## 2020-11-02 DIAGNOSIS — H524 Presbyopia: Secondary | ICD-10-CM | POA: Diagnosis not present

## 2020-11-02 DIAGNOSIS — H5213 Myopia, bilateral: Secondary | ICD-10-CM | POA: Diagnosis not present

## 2020-11-02 DIAGNOSIS — H5203 Hypermetropia, bilateral: Secondary | ICD-10-CM | POA: Diagnosis not present

## 2020-11-02 DIAGNOSIS — H52209 Unspecified astigmatism, unspecified eye: Secondary | ICD-10-CM | POA: Diagnosis not present

## 2020-12-10 ENCOUNTER — Ambulatory Visit
Admission: RE | Admit: 2020-12-10 | Discharge: 2020-12-10 | Disposition: A | Discharge status: Home / Self Care | Payer: Medicare HMO | Source: Ambulatory Visit | Attending: Internal Medicine | Admitting: Internal Medicine

## 2020-12-10 ENCOUNTER — Other Ambulatory Visit: Payer: Self-pay

## 2020-12-10 DIAGNOSIS — Z1231 Encounter for screening mammogram for malignant neoplasm of breast: Secondary | ICD-10-CM

## 2021-01-17 DIAGNOSIS — L84 Corns and callosities: Secondary | ICD-10-CM | POA: Diagnosis not present

## 2021-01-17 DIAGNOSIS — E1151 Type 2 diabetes mellitus with diabetic peripheral angiopathy without gangrene: Secondary | ICD-10-CM | POA: Diagnosis not present

## 2021-01-17 DIAGNOSIS — L603 Nail dystrophy: Secondary | ICD-10-CM | POA: Diagnosis not present

## 2021-01-17 DIAGNOSIS — I739 Peripheral vascular disease, unspecified: Secondary | ICD-10-CM | POA: Diagnosis not present

## 2021-03-01 DIAGNOSIS — I10 Essential (primary) hypertension: Secondary | ICD-10-CM | POA: Diagnosis not present

## 2021-03-01 DIAGNOSIS — E1165 Type 2 diabetes mellitus with hyperglycemia: Secondary | ICD-10-CM | POA: Diagnosis not present

## 2021-03-06 DIAGNOSIS — Z2821 Immunization not carried out because of patient refusal: Secondary | ICD-10-CM | POA: Diagnosis not present

## 2021-03-06 DIAGNOSIS — E1121 Type 2 diabetes mellitus with diabetic nephropathy: Secondary | ICD-10-CM | POA: Diagnosis not present

## 2021-03-06 DIAGNOSIS — E785 Hyperlipidemia, unspecified: Secondary | ICD-10-CM | POA: Diagnosis not present

## 2021-03-06 DIAGNOSIS — M810 Age-related osteoporosis without current pathological fracture: Secondary | ICD-10-CM | POA: Diagnosis not present

## 2021-03-06 DIAGNOSIS — R634 Abnormal weight loss: Secondary | ICD-10-CM | POA: Diagnosis not present

## 2021-03-06 DIAGNOSIS — R0781 Pleurodynia: Secondary | ICD-10-CM | POA: Diagnosis not present

## 2021-03-06 DIAGNOSIS — I10 Essential (primary) hypertension: Secondary | ICD-10-CM | POA: Diagnosis not present

## 2021-03-06 DIAGNOSIS — Z0000 Encounter for general adult medical examination without abnormal findings: Secondary | ICD-10-CM | POA: Diagnosis not present

## 2021-03-06 DIAGNOSIS — E1165 Type 2 diabetes mellitus with hyperglycemia: Secondary | ICD-10-CM | POA: Diagnosis not present

## 2021-03-06 DIAGNOSIS — L304 Erythema intertrigo: Secondary | ICD-10-CM | POA: Diagnosis not present

## 2021-03-06 DIAGNOSIS — L409 Psoriasis, unspecified: Secondary | ICD-10-CM | POA: Diagnosis not present

## 2021-03-06 DIAGNOSIS — R0609 Other forms of dyspnea: Secondary | ICD-10-CM | POA: Diagnosis not present

## 2021-04-19 DIAGNOSIS — L603 Nail dystrophy: Secondary | ICD-10-CM | POA: Diagnosis not present

## 2021-04-19 DIAGNOSIS — L84 Corns and callosities: Secondary | ICD-10-CM | POA: Diagnosis not present

## 2021-04-19 DIAGNOSIS — I739 Peripheral vascular disease, unspecified: Secondary | ICD-10-CM | POA: Diagnosis not present

## 2021-04-19 DIAGNOSIS — E1151 Type 2 diabetes mellitus with diabetic peripheral angiopathy without gangrene: Secondary | ICD-10-CM | POA: Diagnosis not present

## 2021-04-23 DIAGNOSIS — I10 Essential (primary) hypertension: Secondary | ICD-10-CM | POA: Diagnosis not present

## 2021-04-23 DIAGNOSIS — R634 Abnormal weight loss: Secondary | ICD-10-CM | POA: Diagnosis not present

## 2021-04-23 DIAGNOSIS — R0602 Shortness of breath: Secondary | ICD-10-CM | POA: Diagnosis not present

## 2021-06-04 DIAGNOSIS — R600 Localized edema: Secondary | ICD-10-CM | POA: Diagnosis not present

## 2021-06-04 DIAGNOSIS — F439 Reaction to severe stress, unspecified: Secondary | ICD-10-CM | POA: Diagnosis not present

## 2021-06-04 DIAGNOSIS — G4762 Sleep related leg cramps: Secondary | ICD-10-CM | POA: Diagnosis not present

## 2021-06-04 DIAGNOSIS — E1165 Type 2 diabetes mellitus with hyperglycemia: Secondary | ICD-10-CM | POA: Diagnosis not present

## 2021-06-04 DIAGNOSIS — R0602 Shortness of breath: Secondary | ICD-10-CM | POA: Diagnosis not present

## 2021-07-18 DIAGNOSIS — L84 Corns and callosities: Secondary | ICD-10-CM | POA: Diagnosis not present

## 2021-07-18 DIAGNOSIS — E1151 Type 2 diabetes mellitus with diabetic peripheral angiopathy without gangrene: Secondary | ICD-10-CM | POA: Diagnosis not present

## 2021-07-18 DIAGNOSIS — L603 Nail dystrophy: Secondary | ICD-10-CM | POA: Diagnosis not present

## 2021-07-18 DIAGNOSIS — I739 Peripheral vascular disease, unspecified: Secondary | ICD-10-CM | POA: Diagnosis not present

## 2021-08-30 DIAGNOSIS — S0180XA Unspecified open wound of other part of head, initial encounter: Secondary | ICD-10-CM | POA: Diagnosis not present

## 2021-10-11 DIAGNOSIS — E1151 Type 2 diabetes mellitus with diabetic peripheral angiopathy without gangrene: Secondary | ICD-10-CM | POA: Diagnosis not present

## 2021-10-11 DIAGNOSIS — I739 Peripheral vascular disease, unspecified: Secondary | ICD-10-CM | POA: Diagnosis not present

## 2021-10-11 DIAGNOSIS — L603 Nail dystrophy: Secondary | ICD-10-CM | POA: Diagnosis not present

## 2021-10-11 DIAGNOSIS — L84 Corns and callosities: Secondary | ICD-10-CM | POA: Diagnosis not present

## 2021-11-13 ENCOUNTER — Other Ambulatory Visit: Payer: Self-pay | Admitting: Internal Medicine

## 2021-11-13 DIAGNOSIS — Z1231 Encounter for screening mammogram for malignant neoplasm of breast: Secondary | ICD-10-CM

## 2021-12-11 ENCOUNTER — Ambulatory Visit
Admission: RE | Admit: 2021-12-11 | Discharge: 2021-12-11 | Disposition: A | Discharge status: Home / Self Care | Payer: Medicare PPO | Source: Ambulatory Visit | Attending: Internal Medicine | Admitting: Internal Medicine

## 2021-12-11 DIAGNOSIS — Z1231 Encounter for screening mammogram for malignant neoplasm of breast: Secondary | ICD-10-CM | POA: Diagnosis not present

## 2022-01-28 DIAGNOSIS — E1151 Type 2 diabetes mellitus with diabetic peripheral angiopathy without gangrene: Secondary | ICD-10-CM | POA: Diagnosis not present

## 2022-01-28 DIAGNOSIS — L603 Nail dystrophy: Secondary | ICD-10-CM | POA: Diagnosis not present

## 2022-01-28 DIAGNOSIS — L84 Corns and callosities: Secondary | ICD-10-CM | POA: Diagnosis not present

## 2022-01-28 DIAGNOSIS — I739 Peripheral vascular disease, unspecified: Secondary | ICD-10-CM | POA: Diagnosis not present

## 2022-02-25 IMAGING — US US BREAST*L* LIMITED INC AXILLA
1 series · 9 of 9 positions shown · non-contrast
Comparison: Previous exam(s).

CLINICAL DATA: Left nipple inversion since surgical excision of a
left breast complex sclerosing lesion in 8181.

EXAM:
DIGITAL DIAGNOSTIC BILATERAL MAMMOGRAM WITH CAD AND TOMO
ULTRASOUND LEFT BREAST

[Series 1: us breast*left* limited inc axilla · 0.07mm/px · 9 of 9 slices shown]
[im 1/9]
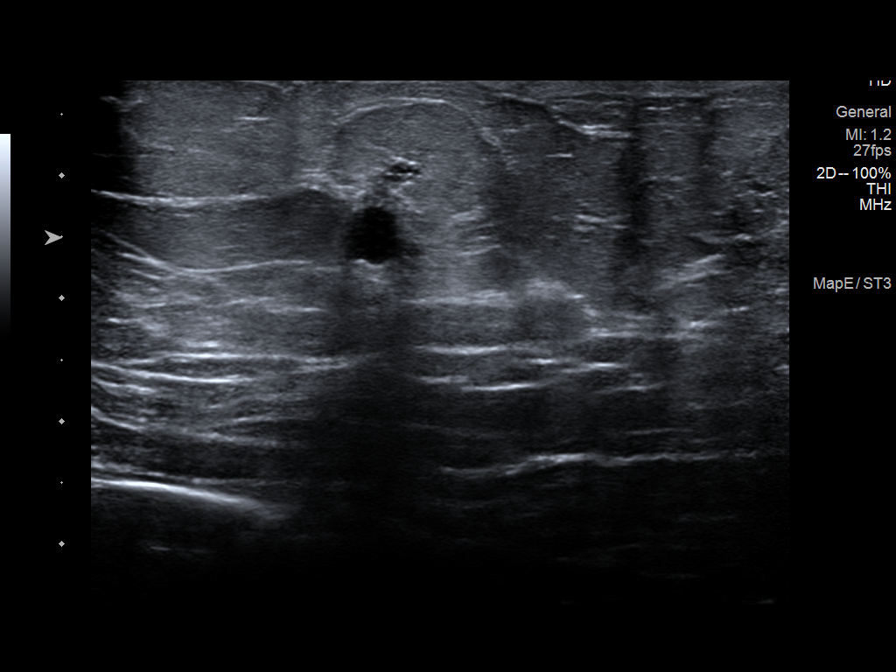
[im 2/9]
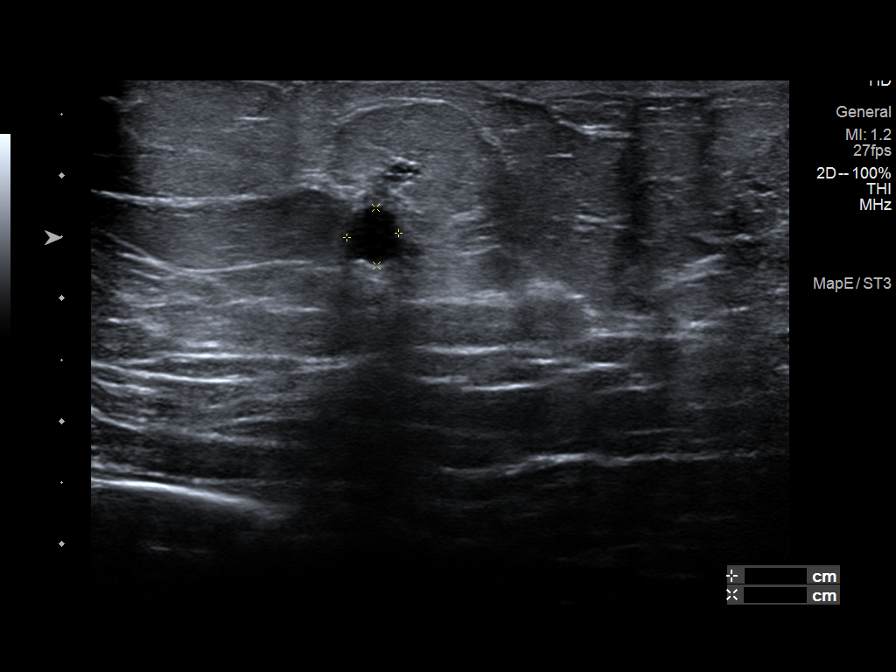
[im 3/9]
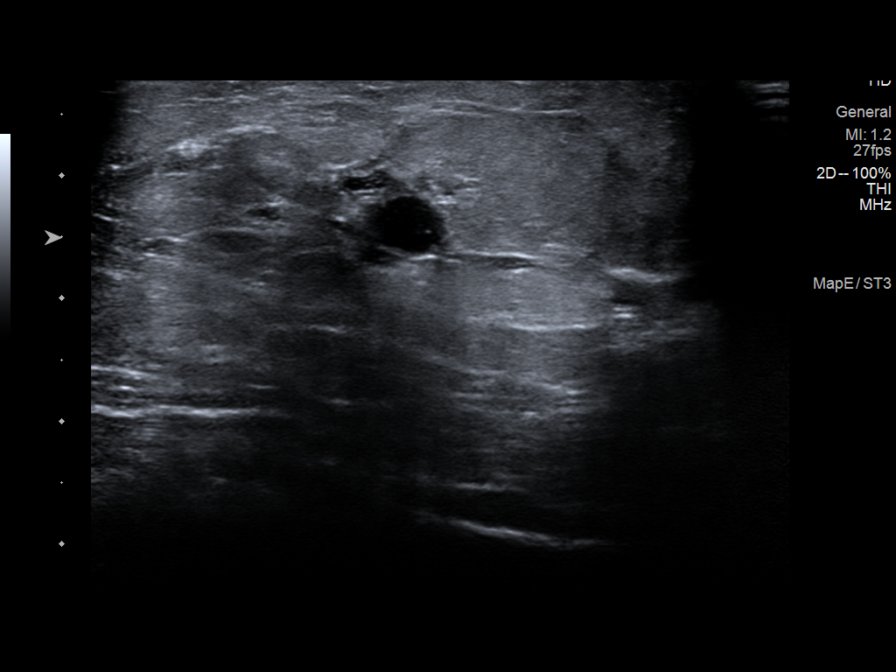
[im 4/9]
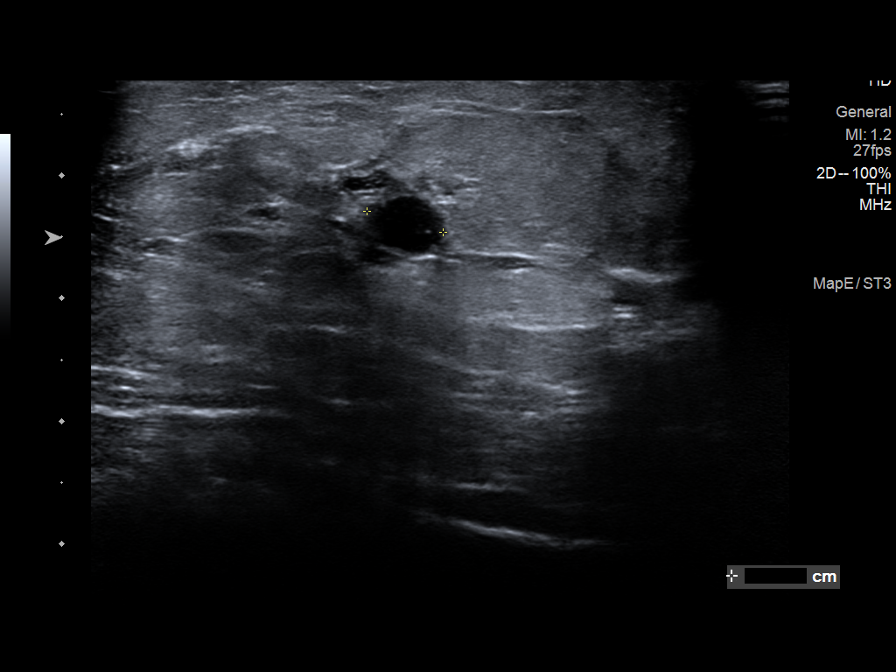
[im 5/9]
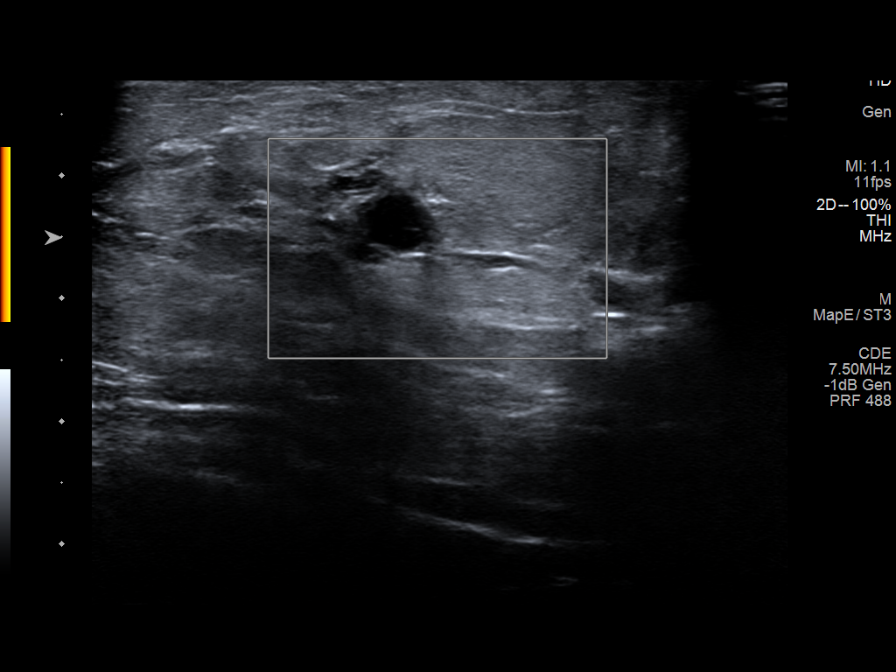
[im 6/9]
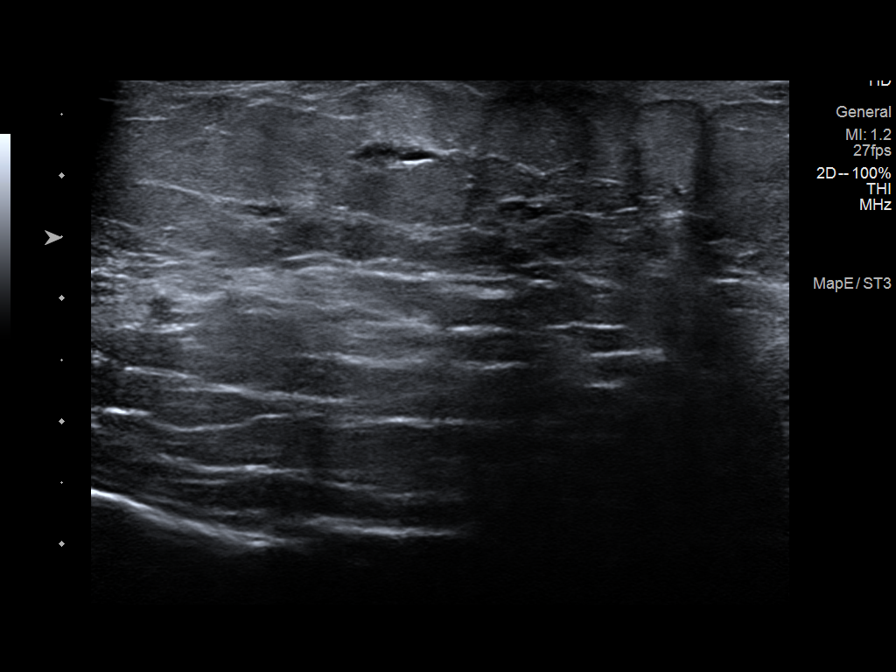
[im 7/9]
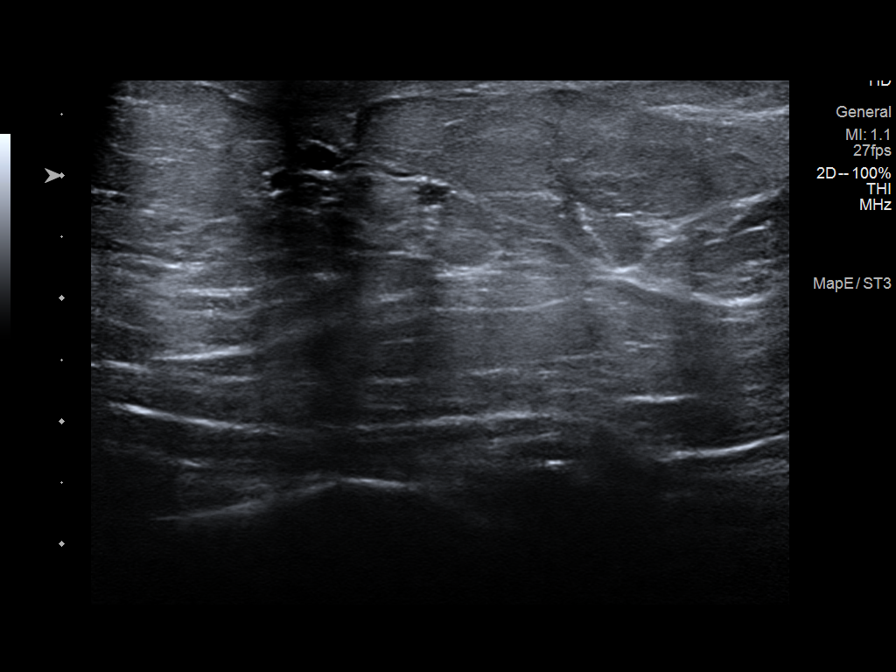
[im 8/9]
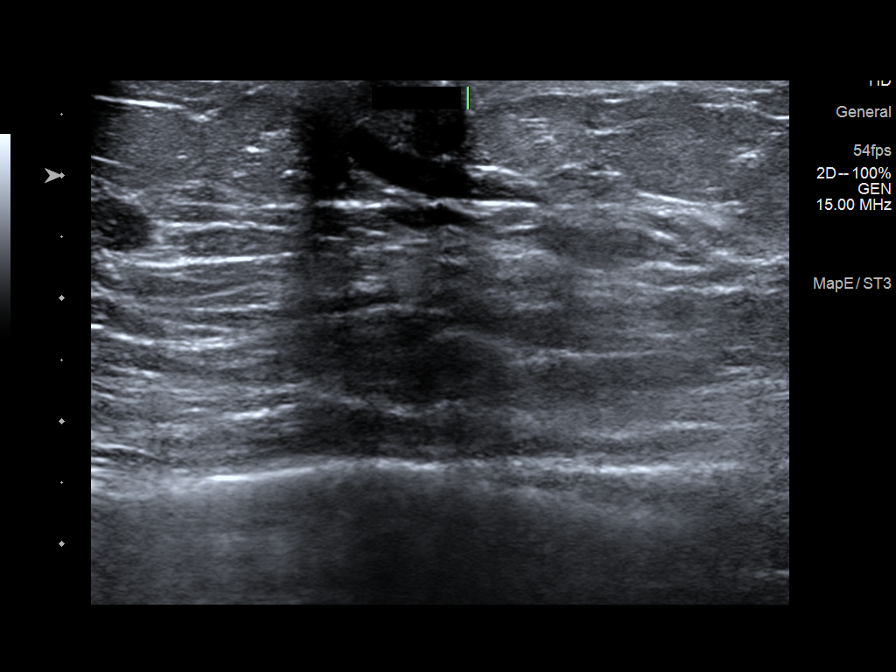
[im 9/9]
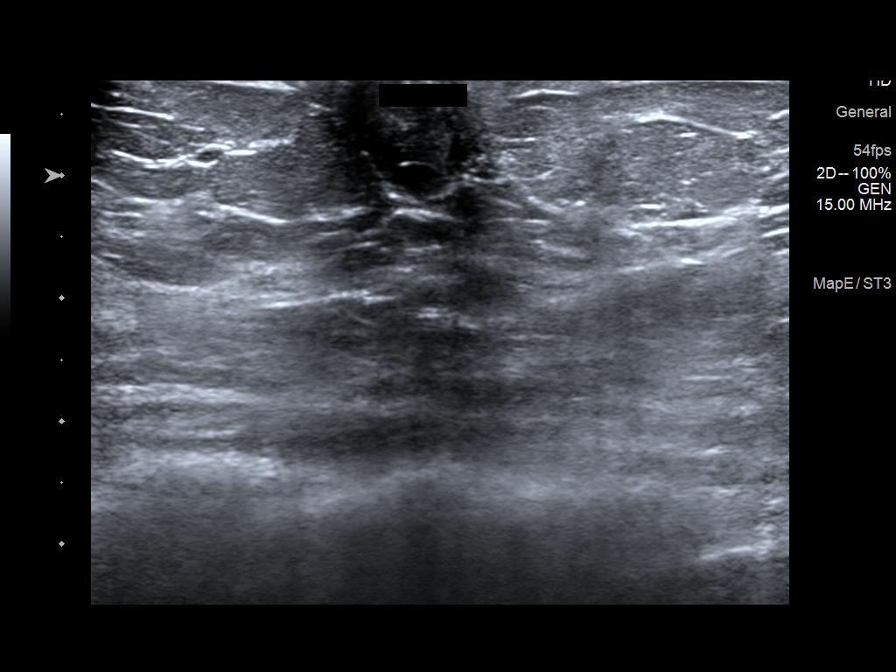

[9 of 9 positions shown; findings below may reference images not displayed]

ACR Breast Density Category c: The breast tissue is heterogeneously
dense, which may obscure small masses.
FINDINGS: Interval postsurgical changes in the outer left breast with surgical
absence of the previously seen spiculated mass. Multiple rounded and
oval, circumscribed masses in both breasts compatible with
previously demonstrated benign nodules and cysts. No interval
findings suspicious for malignancy in either breast.

Mammographic images were processed with CAD.

On physical exam, the patient's left nipple is inverted with no skin
abnormalities and no palpable mass.

Targeted ultrasound is performed, showing a dilated retronipple duct
without an associated mass. There is also a 6 mm cyst in the 9
o'clock retroareolar left breast. There were additional nearby small
cysts at real-time.
IMPRESSION: 1. Inverted left nipple compatible with normal postsurgical nipple
inversion associated with tissue removal and scarring in the breast.
2. Benign left breast duct ectasia and small cysts.
3. No evidence of malignancy.

RECOMMENDATION:
Bilateral screening mammogram in 1 year.

I have discussed the findings and recommendations with the patient.
If applicable, a reminder letter will be sent to the patient
regarding the next appointment.

BI-RADS CATEGORY  2: Benign.

## 2022-02-25 IMAGING — MG DIGITAL DIAGNOSTIC BILAT W/ TOMO W/ CAD
6 of 10 series · 6 of 30 positions shown · non-contrast
Comparison: Previous exam(s).

CLINICAL DATA: Left nipple inversion since surgical excision of a
left breast complex sclerosing lesion in 8181.

EXAM:
DIGITAL DIAGNOSTIC BILATERAL MAMMOGRAM WITH CAD AND TOMO
ULTRASOUND LEFT BREAST

[L CC synth-2D]
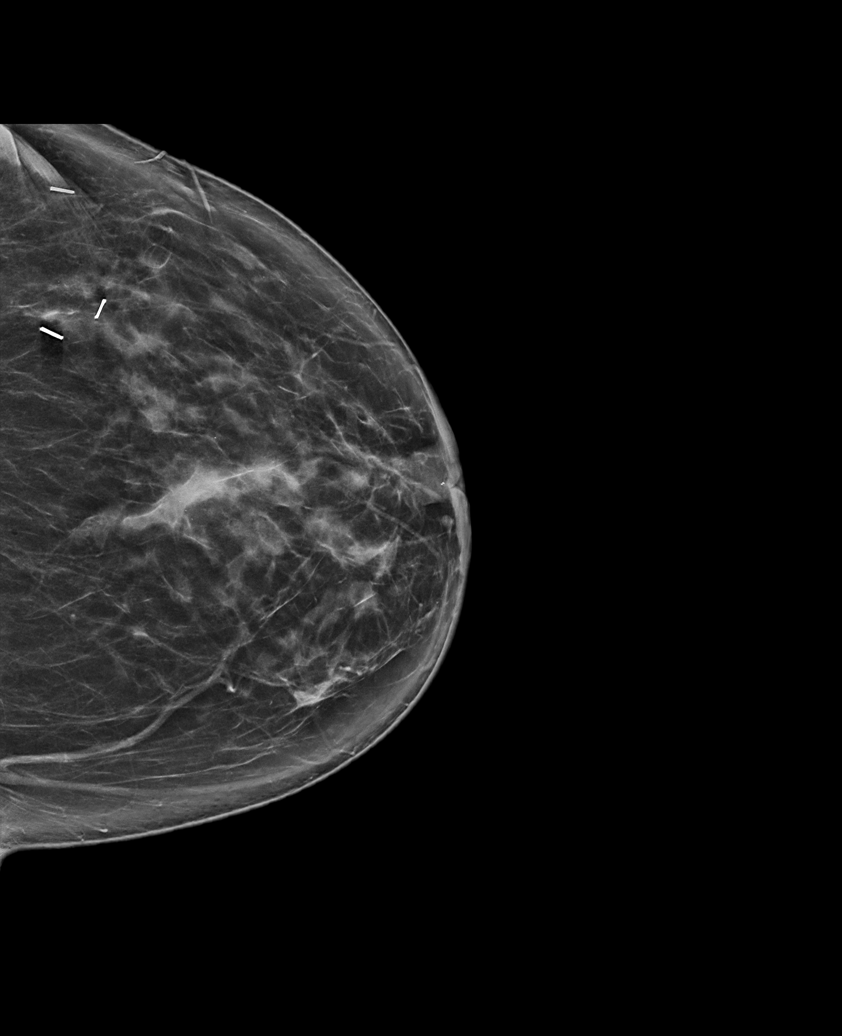

[R CC synth-2D]
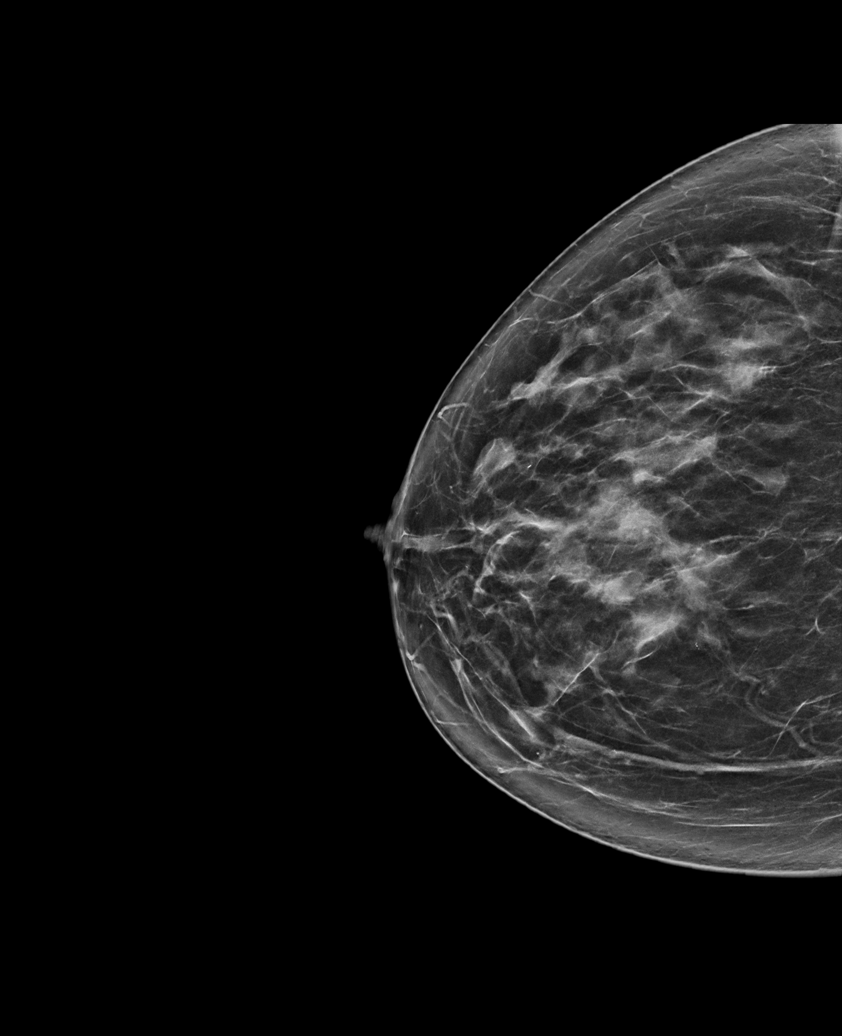

[L MLO synth-2D (1 of 2)]
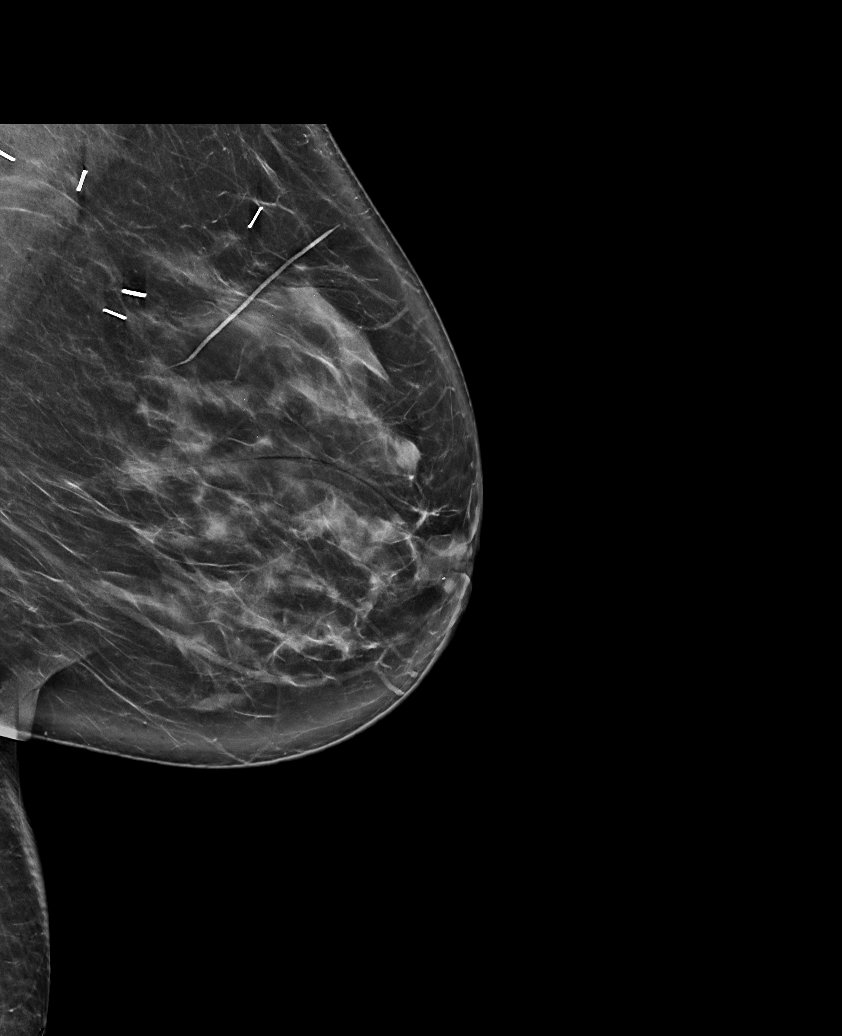

[R MLO synth-2D]
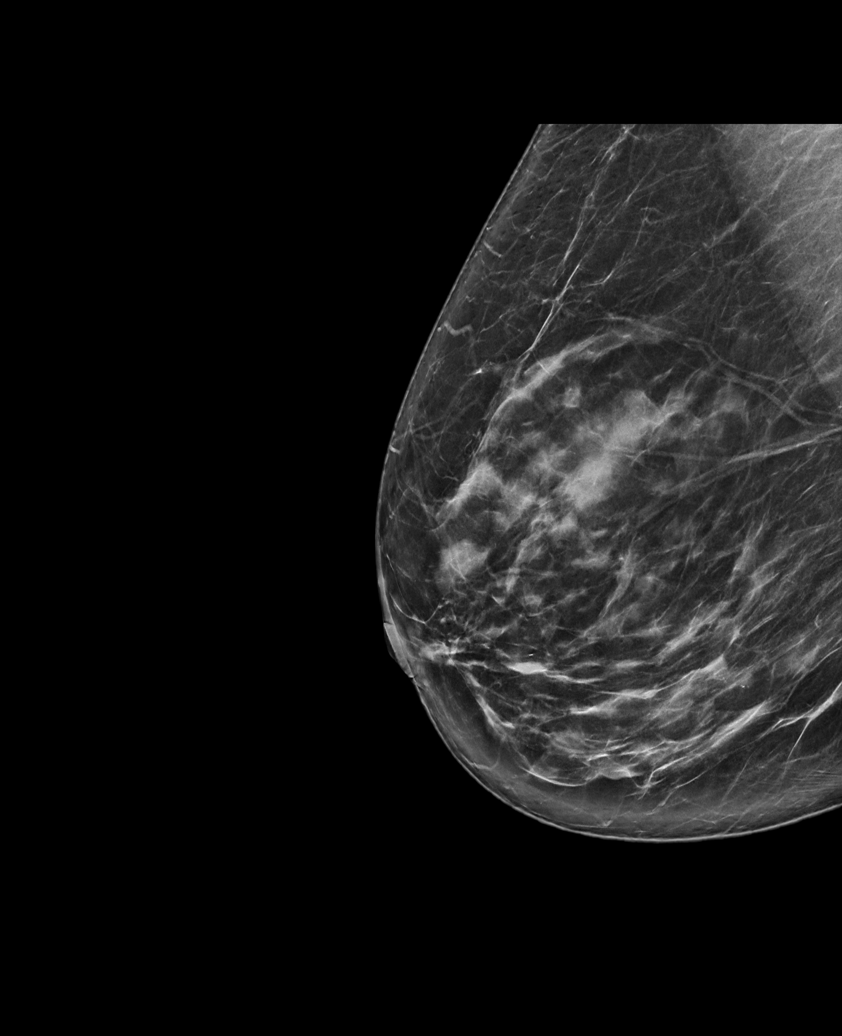

[L MLO synth-2D (2 of 2)]
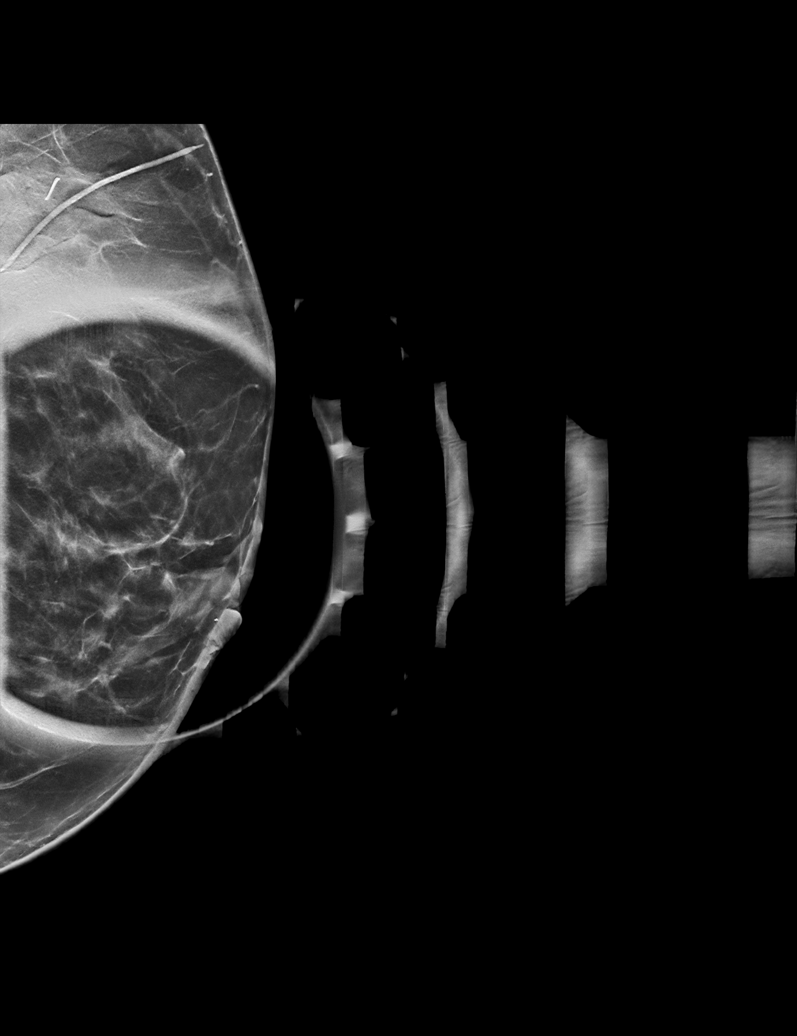

[R MLO tomo · tomo slice 38/75.0]
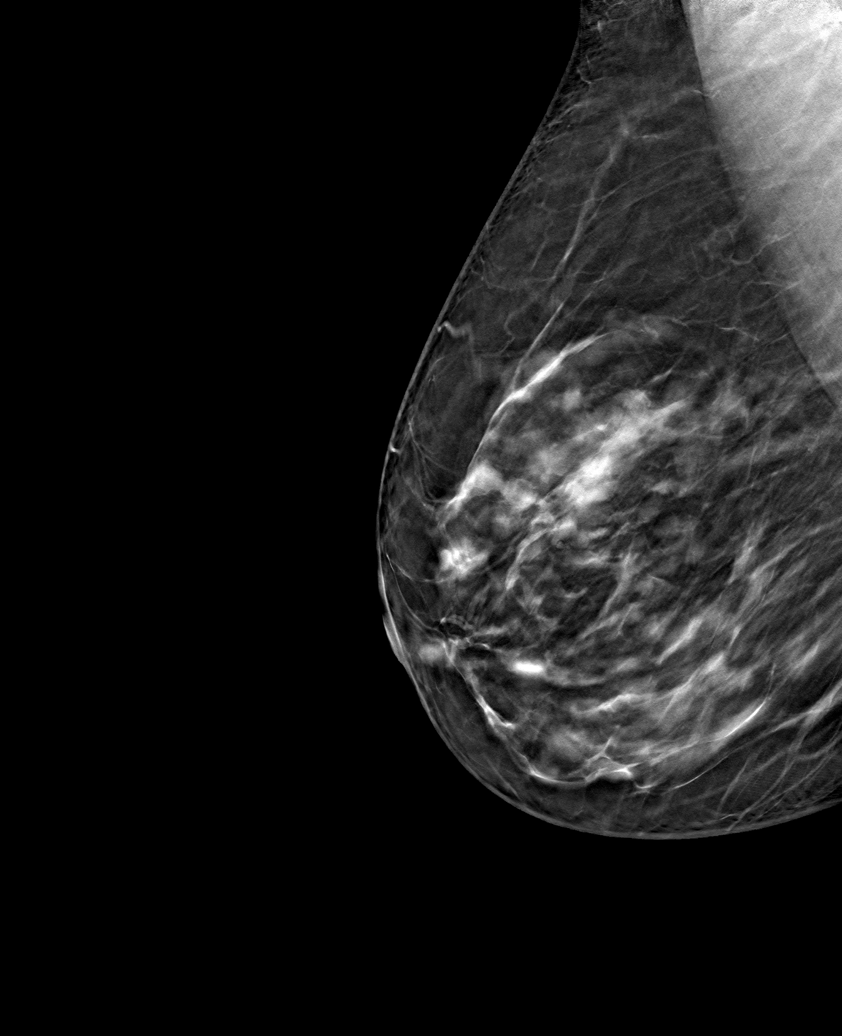

[6 of 30 positions shown; findings below may reference images not displayed]

ACR Breast Density Category c: The breast tissue is heterogeneously
dense, which may obscure small masses.
FINDINGS: Interval postsurgical changes in the outer left breast with surgical
absence of the previously seen spiculated mass. Multiple rounded and
oval, circumscribed masses in both breasts compatible with
previously demonstrated benign nodules and cysts. No interval
findings suspicious for malignancy in either breast.

Mammographic images were processed with CAD.

On physical exam, the patient's left nipple is inverted with no skin
abnormalities and no palpable mass.

Targeted ultrasound is performed, showing a dilated retronipple duct
without an associated mass. There is also a 6 mm cyst in the 9
o'clock retroareolar left breast. There were additional nearby small
cysts at real-time.
IMPRESSION: 1. Inverted left nipple compatible with normal postsurgical nipple
inversion associated with tissue removal and scarring in the breast.
2. Benign left breast duct ectasia and small cysts.
3. No evidence of malignancy.

RECOMMENDATION:
Bilateral screening mammogram in 1 year.

I have discussed the findings and recommendations with the patient.
If applicable, a reminder letter will be sent to the patient
regarding the next appointment.

BI-RADS CATEGORY  2: Benign.

## 2022-03-06 DIAGNOSIS — E1165 Type 2 diabetes mellitus with hyperglycemia: Secondary | ICD-10-CM | POA: Diagnosis not present

## 2022-03-20 DIAGNOSIS — Z0000 Encounter for general adult medical examination without abnormal findings: Secondary | ICD-10-CM | POA: Diagnosis not present

## 2022-03-20 DIAGNOSIS — G2581 Restless legs syndrome: Secondary | ICD-10-CM | POA: Diagnosis not present

## 2022-03-20 DIAGNOSIS — E1121 Type 2 diabetes mellitus with diabetic nephropathy: Secondary | ICD-10-CM | POA: Diagnosis not present

## 2022-03-20 DIAGNOSIS — R2689 Other abnormalities of gait and mobility: Secondary | ICD-10-CM | POA: Diagnosis not present

## 2022-03-20 DIAGNOSIS — F411 Generalized anxiety disorder: Secondary | ICD-10-CM | POA: Diagnosis not present

## 2022-03-20 DIAGNOSIS — Z23 Encounter for immunization: Secondary | ICD-10-CM | POA: Diagnosis not present

## 2022-03-20 DIAGNOSIS — M810 Age-related osteoporosis without current pathological fracture: Secondary | ICD-10-CM | POA: Diagnosis not present

## 2022-03-20 DIAGNOSIS — I10 Essential (primary) hypertension: Secondary | ICD-10-CM | POA: Diagnosis not present

## 2022-04-03 NOTE — Therapy (Incomplete)
OUTPATIENT PHYSICAL THERAPY NEURO EVALUATION   Patient Name: Melissa Glenn MRN: [id number] DOB:05-14-1930, 89 y.o., female Today's Date: 04/03/2022   PCP: [name] REFERRING PROVIDER: [name]  END OF SESSION:   Past Medical History:  Diagnosis Date   Abnormality of left breast on screening mammogram 01/04/2019   Arthritis    mild -generalized   Cancer ([place])    Complication of anesthesia 07-19-12   note with chart DOS 01-11-86, "small mouth"   Diabetes mellitus    TYPE 2   GERD (gastroesophageal reflux disease)    High cholesterol    Hypertension    Postmenopausal    Varicose veins of leg with swelling 2012   bilateral lower extremity Varicose Veins w/ swelling/pain   Vitamin D deficiency    Past Surgical History:  Procedure Laterality Date   APPENDECTOMY  1965   BREAST LUMPECTOMY WITH RADIOACTIVE SEED LOCALIZATION Left 01/04/2019   Procedure: LEFT BREAST LUMPECTOMY WITH RADIOACTIVE SEED LOCALIZATION;  Surgeon: Fanny Skates, MD;  Location: [facility];  Service: General;  Laterality: Left;   CATARACT EXTRACTION, BILATERAL  2007   CHOLECYSTECTOMY  1980   EYE SURGERY     KNEE ARTHROSCOPY  2008   right   PUBOVAGINAL SLING N/A 07/23/2012   Procedure: Pubo-Vaginal Solyx Sling;  Surgeon: Ailene Rud, MD;  Location: [facility];  Service: Urology;  Laterality: N/A;   SIGMOIDOSCOPY  2004   TONSILLECTOMY     TUBAL LIGATION  1965   VAGINAL PROLAPSE REPAIR N/A 07/23/2012   Procedure: UP HOLD LITE ANTERIOR VAULT SUSPENSION;  Surgeon: Ailene Rud, MD;  Location: [facility];  Service: Urology;  Laterality: N/A;   Patient Active Problem List   Diagnosis Date Noted   Abnormality of left breast on screening mammogram 01/04/2019   Type II diabetes mellitus ([place]) 04/09/2012   Pelvic relaxation due to cystocele 03/25/2011   High cholesterol     ONSET DATE: 03/20/22  REFERRING DIAG: R26.890 other abnormalities of gait and mobility  THERAPY DIAG:  No diagnosis  found.  Rationale for Evaluation and Treatment: Rehabilitation  SUBJECTIVE:                                                                                                                                                                                             SUBJECTIVE STATEMENT: *** Pt accompanied by: {accompnied:27141}  PERTINENT HISTORY: ***  PAIN:  Are you having pain? {OPRCPAIN:27236}  PRECAUTIONS: {Therapy precautions:24002}  WEIGHT BEARING RESTRICTIONS: {Yes ***/No:24003}  FALLS: Has patient fallen in last 6 months? {fallsyesno:27318}  LIVING ENVIRONMENT: Lives with: {OPRC lives with:25569::"lives with their family"} Lives in: {  Lives in:25570} Stairs: {opstairs:27293} Has following equipment at home: {Assistive devices:23999}  PLOF: {PLOF:24004}  PATIENT GOALS: ***  OBJECTIVE:   DIAGNOSTIC FINDINGS: ***  COGNITION: Overall cognitive status: {cognition:24006}   SENSATION: {sensation:27233}  COORDINATION: ***  EDEMA:  {edema:24020}  MUSCLE TONE: {LE tone:25568}  MUSCLE LENGTH: Hamstrings: Right *** deg; Left *** deg Thomas test: Right *** deg; Left *** deg  DTRs:  {DTR SITE:24025}  POSTURE: {posture:25561}  LOWER EXTREMITY ROM:     {AROM/PROM:27142}  Right Eval Left Eval  Hip flexion    Hip extension    Hip abduction    Hip adduction    Hip internal rotation    Hip external rotation    Knee flexion    Knee extension    Ankle dorsiflexion    Ankle plantarflexion    Ankle inversion    Ankle eversion     (Blank rows = not tested)  LOWER EXTREMITY MMT:    MMT Right Eval Left Eval  Hip flexion    Hip extension    Hip abduction    Hip adduction    Hip internal rotation    Hip external rotation    Knee flexion    Knee extension    Ankle dorsiflexion    Ankle plantarflexion    Ankle inversion    Ankle eversion    (Blank rows = not tested)  BED MOBILITY:  {Bed mobility:24027}  TRANSFERS: Assistive device utilized:  {Assistive devices:23999}  Sit to stand: {Levels of assistance:24026} Stand to sit: {Levels of assistance:24026} Chair to chair: {Levels of assistance:24026} Floor: {Levels of assistance:24026}  RAMP:  Level of Assistance: {Levels of assistance:24026} Assistive device utilized: {Assistive devices:23999} Ramp Comments: ***  CURB:  Level of Assistance: {Levels of assistance:24026} Assistive device utilized: {Assistive devices:23999} Curb Comments: ***  STAIRS: Level of Assistance: {Levels of assistance:24026} Stair Negotiation Technique: {Stair Technique:27161} with {Rail Assistance:27162} Number of Stairs: ***  Height of Stairs: ***  Comments: ***  GAIT: Gait pattern: {gait characteristics:25376} Distance walked: *** Assistive device utilized: {Assistive devices:23999} Level of assistance: {Levels of assistance:24026} Comments: ***  FUNCTIONAL TESTS:  {Functional tests:24029}  PATIENT SURVEYS:  {rehab surveys:24030}  TODAY'S TREATMENT:                                                                                                                              DATE: ***    PATIENT EDUCATION: Education details: *** Person educated: {Person educated:25204} Education method: {Education Method:25205} Education comprehension: {Education Comprehension:25206}  HOME EXERCISE PROGRAM: ***  GOALS: Goals reviewed with patient? {yes/no:20286}  SHORT TERM GOALS: Target date: {follow up:25551} (Remove Blue Hyperlink)  Patient will be independent with initial HEP. Baseline: *** Goal status: {GOALSTATUS:25110}  2.  Patient will demonstrate decreased fall risk by scoring < 25 sec on TUG. Baseline: *** Goal status: {GOALSTATUS:25110}  3.  Patient will be educated on strategies to decrease risk of falls.  Baseline: *** Goal status: {GOALSTATUS:25110}   LONG TERM  GOALS: Target date: {follow up:25551} (Remove Blue Hyperlink)  Patient will be independent with  advanced/ongoing HEP to improve outcomes and carryover.  Baseline: *** Goal status: {GOALSTATUS:25110}  2.  Patient will be able to ambulate 600' with LRAD with good safety to access community.  Baseline: *** Goal status: {GOALSTATUS:25110}  3.  Patient will be able to step up/down curb safely with LRAD for safety with community ambulation.  Baseline: *** Goal status: {GOALSTATUS:25110}   4.  Patient will demonstrate improved functional LE strength as demonstrated by ***. Baseline: *** Goal status: {GOALSTATUS:25110}  5.  Patient will demonstrate at least 19/24 on DGI to improve gait stability and reduce risk for falls. Baseline: *** Goal status: {GOALSTATUS:25110}  6.  Patient will score *** on Berg Balance test to demonstrate lower risk of falls. (MCID= 8 points) .  Baseline: *** Goal status: {GOALSTATUS:25110}  7.  Patient will report *** on *** (patient reported outcome measure) to demonstrate improved functional ability. Baseline: *** Goal status: {GOALSTATUS:25110}  8.  Patient will demonstrate gait speed of >/= 1.8 ft/sec (0.55 m/s) to be a safe limited community ambulator with decreased risk for recurrent falls.  Baseline: *** Goal status: {GOALSTATUS:25110}   ASSESSMENT:  CLINICAL IMPRESSION: Patient is a 89 y.o. female who was seen today for physical therapy evaluation and treatment for ***.   OBJECTIVE IMPAIRMENTS: {opptimpairments:25111}.   ACTIVITY LIMITATIONS: {activitylimitations:27494}  PARTICIPATION LIMITATIONS: {participationrestrictions:25113}  PERSONAL FACTORS: {Personal factors:25162} are also affecting patient's functional outcome.   REHAB POTENTIAL: {rehabpotential:25112}  CLINICAL DECISION MAKING: {clinical decision making:25114}  EVALUATION COMPLEXITY: {Evaluation complexity:25115}  PLAN:  PT FREQUENCY: {rehab frequency:25116}  PT DURATION: {rehab duration:25117}  PLANNED INTERVENTIONS: {rehab planned interventions:25118::"Therapeutic  exercises","Therapeutic activity","Neuromuscular re-education","Balance training","Gait training","Patient/Family education","Self Care","Joint mobilization"}  PLAN FOR NEXT SESSION: ***   Andris Baumann, PT 04/03/2022, 11:13 AM

## 2022-04-04 ENCOUNTER — Ambulatory Visit: Payer: Medicare HMO | Attending: Internal Medicine

## 2022-04-10 DIAGNOSIS — M5451 Vertebrogenic low back pain: Secondary | ICD-10-CM | POA: Diagnosis not present

## 2022-04-10 DIAGNOSIS — R260 Ataxic gait: Secondary | ICD-10-CM | POA: Diagnosis not present

## 2022-04-10 DIAGNOSIS — M6281 Muscle weakness (generalized): Secondary | ICD-10-CM | POA: Diagnosis not present

## 2022-04-10 DIAGNOSIS — R2681 Unsteadiness on feet: Secondary | ICD-10-CM | POA: Diagnosis not present

## 2022-04-14 DIAGNOSIS — R260 Ataxic gait: Secondary | ICD-10-CM | POA: Diagnosis not present

## 2022-04-14 DIAGNOSIS — R2681 Unsteadiness on feet: Secondary | ICD-10-CM | POA: Diagnosis not present

## 2022-04-14 DIAGNOSIS — M6281 Muscle weakness (generalized): Secondary | ICD-10-CM | POA: Diagnosis not present

## 2022-04-14 DIAGNOSIS — M5451 Vertebrogenic low back pain: Secondary | ICD-10-CM | POA: Diagnosis not present

## 2022-04-16 DIAGNOSIS — R2681 Unsteadiness on feet: Secondary | ICD-10-CM | POA: Diagnosis not present

## 2022-04-16 DIAGNOSIS — M5451 Vertebrogenic low back pain: Secondary | ICD-10-CM | POA: Diagnosis not present

## 2022-04-16 DIAGNOSIS — M6281 Muscle weakness (generalized): Secondary | ICD-10-CM | POA: Diagnosis not present

## 2022-04-16 DIAGNOSIS — R260 Ataxic gait: Secondary | ICD-10-CM | POA: Diagnosis not present

## 2022-04-21 DIAGNOSIS — M6281 Muscle weakness (generalized): Secondary | ICD-10-CM | POA: Diagnosis not present

## 2022-04-21 DIAGNOSIS — R260 Ataxic gait: Secondary | ICD-10-CM | POA: Diagnosis not present

## 2022-04-21 DIAGNOSIS — R2681 Unsteadiness on feet: Secondary | ICD-10-CM | POA: Diagnosis not present

## 2022-04-21 DIAGNOSIS — M5451 Vertebrogenic low back pain: Secondary | ICD-10-CM | POA: Diagnosis not present

## 2022-04-23 DIAGNOSIS — M5451 Vertebrogenic low back pain: Secondary | ICD-10-CM | POA: Diagnosis not present

## 2022-04-23 DIAGNOSIS — M6281 Muscle weakness (generalized): Secondary | ICD-10-CM | POA: Diagnosis not present

## 2022-04-23 DIAGNOSIS — R260 Ataxic gait: Secondary | ICD-10-CM | POA: Diagnosis not present

## 2022-04-23 DIAGNOSIS — R2681 Unsteadiness on feet: Secondary | ICD-10-CM | POA: Diagnosis not present

## 2022-04-25 DIAGNOSIS — R260 Ataxic gait: Secondary | ICD-10-CM | POA: Diagnosis not present

## 2022-04-25 DIAGNOSIS — R2681 Unsteadiness on feet: Secondary | ICD-10-CM | POA: Diagnosis not present

## 2022-04-25 DIAGNOSIS — M5451 Vertebrogenic low back pain: Secondary | ICD-10-CM | POA: Diagnosis not present

## 2022-04-25 DIAGNOSIS — M6281 Muscle weakness (generalized): Secondary | ICD-10-CM | POA: Diagnosis not present

## 2022-04-28 DIAGNOSIS — M6281 Muscle weakness (generalized): Secondary | ICD-10-CM | POA: Diagnosis not present

## 2022-04-28 DIAGNOSIS — R2681 Unsteadiness on feet: Secondary | ICD-10-CM | POA: Diagnosis not present

## 2022-04-28 DIAGNOSIS — M5451 Vertebrogenic low back pain: Secondary | ICD-10-CM | POA: Diagnosis not present

## 2022-04-28 DIAGNOSIS — R260 Ataxic gait: Secondary | ICD-10-CM | POA: Diagnosis not present

## 2022-04-30 DIAGNOSIS — I739 Peripheral vascular disease, unspecified: Secondary | ICD-10-CM | POA: Diagnosis not present

## 2022-04-30 DIAGNOSIS — E1151 Type 2 diabetes mellitus with diabetic peripheral angiopathy without gangrene: Secondary | ICD-10-CM | POA: Diagnosis not present

## 2022-04-30 DIAGNOSIS — L84 Corns and callosities: Secondary | ICD-10-CM | POA: Diagnosis not present

## 2022-04-30 DIAGNOSIS — L603 Nail dystrophy: Secondary | ICD-10-CM | POA: Diagnosis not present

## 2022-05-01 DIAGNOSIS — R2689 Other abnormalities of gait and mobility: Secondary | ICD-10-CM | POA: Diagnosis not present

## 2022-05-01 DIAGNOSIS — F411 Generalized anxiety disorder: Secondary | ICD-10-CM | POA: Diagnosis not present

## 2022-05-05 DIAGNOSIS — M6281 Muscle weakness (generalized): Secondary | ICD-10-CM | POA: Diagnosis not present

## 2022-05-05 DIAGNOSIS — R2681 Unsteadiness on feet: Secondary | ICD-10-CM | POA: Diagnosis not present

## 2022-05-05 DIAGNOSIS — R260 Ataxic gait: Secondary | ICD-10-CM | POA: Diagnosis not present

## 2022-05-05 DIAGNOSIS — M5451 Vertebrogenic low back pain: Secondary | ICD-10-CM | POA: Diagnosis not present

## 2022-07-01 DIAGNOSIS — E785 Hyperlipidemia, unspecified: Secondary | ICD-10-CM | POA: Diagnosis not present

## 2022-07-01 DIAGNOSIS — I10 Essential (primary) hypertension: Secondary | ICD-10-CM | POA: Diagnosis not present

## 2022-07-01 DIAGNOSIS — M810 Age-related osteoporosis without current pathological fracture: Secondary | ICD-10-CM | POA: Diagnosis not present

## 2022-07-01 DIAGNOSIS — E1140 Type 2 diabetes mellitus with diabetic neuropathy, unspecified: Secondary | ICD-10-CM | POA: Diagnosis not present

## 2022-08-11 DIAGNOSIS — L603 Nail dystrophy: Secondary | ICD-10-CM | POA: Diagnosis not present

## 2022-08-11 DIAGNOSIS — L84 Corns and callosities: Secondary | ICD-10-CM | POA: Diagnosis not present

## 2022-08-11 DIAGNOSIS — I739 Peripheral vascular disease, unspecified: Secondary | ICD-10-CM | POA: Diagnosis not present

## 2022-08-11 DIAGNOSIS — E1151 Type 2 diabetes mellitus with diabetic peripheral angiopathy without gangrene: Secondary | ICD-10-CM | POA: Diagnosis not present

## 2022-08-21 DIAGNOSIS — M9902 Segmental and somatic dysfunction of thoracic region: Secondary | ICD-10-CM | POA: Diagnosis not present

## 2022-08-21 DIAGNOSIS — M5414 Radiculopathy, thoracic region: Secondary | ICD-10-CM | POA: Diagnosis not present

## 2022-08-26 DIAGNOSIS — M9902 Segmental and somatic dysfunction of thoracic region: Secondary | ICD-10-CM | POA: Diagnosis not present

## 2022-08-26 DIAGNOSIS — M5414 Radiculopathy, thoracic region: Secondary | ICD-10-CM | POA: Diagnosis not present

## 2022-08-28 DIAGNOSIS — M9902 Segmental and somatic dysfunction of thoracic region: Secondary | ICD-10-CM | POA: Diagnosis not present

## 2022-08-28 DIAGNOSIS — M5414 Radiculopathy, thoracic region: Secondary | ICD-10-CM | POA: Diagnosis not present

## 2022-09-02 DIAGNOSIS — M5414 Radiculopathy, thoracic region: Secondary | ICD-10-CM | POA: Diagnosis not present

## 2022-09-02 DIAGNOSIS — M9902 Segmental and somatic dysfunction of thoracic region: Secondary | ICD-10-CM | POA: Diagnosis not present

## 2022-09-05 DIAGNOSIS — M5414 Radiculopathy, thoracic region: Secondary | ICD-10-CM | POA: Diagnosis not present

## 2022-09-05 DIAGNOSIS — M9902 Segmental and somatic dysfunction of thoracic region: Secondary | ICD-10-CM | POA: Diagnosis not present

## 2022-09-11 DIAGNOSIS — M9902 Segmental and somatic dysfunction of thoracic region: Secondary | ICD-10-CM | POA: Diagnosis not present

## 2022-09-11 DIAGNOSIS — M5414 Radiculopathy, thoracic region: Secondary | ICD-10-CM | POA: Diagnosis not present

## 2022-09-16 DIAGNOSIS — M9902 Segmental and somatic dysfunction of thoracic region: Secondary | ICD-10-CM | POA: Diagnosis not present

## 2022-09-16 DIAGNOSIS — M5414 Radiculopathy, thoracic region: Secondary | ICD-10-CM | POA: Diagnosis not present

## 2022-09-18 DIAGNOSIS — M5414 Radiculopathy, thoracic region: Secondary | ICD-10-CM | POA: Diagnosis not present

## 2022-09-18 DIAGNOSIS — M9902 Segmental and somatic dysfunction of thoracic region: Secondary | ICD-10-CM | POA: Diagnosis not present

## 2022-09-23 DIAGNOSIS — M5414 Radiculopathy, thoracic region: Secondary | ICD-10-CM | POA: Diagnosis not present

## 2022-09-23 DIAGNOSIS — M9902 Segmental and somatic dysfunction of thoracic region: Secondary | ICD-10-CM | POA: Diagnosis not present

## 2022-09-25 DIAGNOSIS — M5414 Radiculopathy, thoracic region: Secondary | ICD-10-CM | POA: Diagnosis not present

## 2022-09-25 DIAGNOSIS — M9902 Segmental and somatic dysfunction of thoracic region: Secondary | ICD-10-CM | POA: Diagnosis not present

## 2022-10-02 DIAGNOSIS — M5414 Radiculopathy, thoracic region: Secondary | ICD-10-CM | POA: Diagnosis not present

## 2022-10-02 DIAGNOSIS — M9902 Segmental and somatic dysfunction of thoracic region: Secondary | ICD-10-CM | POA: Diagnosis not present

## 2022-10-27 DIAGNOSIS — M545 Low back pain, unspecified: Secondary | ICD-10-CM | POA: Diagnosis not present

## 2022-10-27 DIAGNOSIS — R600 Localized edema: Secondary | ICD-10-CM | POA: Diagnosis not present

## 2022-10-27 DIAGNOSIS — N644 Mastodynia: Secondary | ICD-10-CM | POA: Diagnosis not present

## 2022-10-28 ENCOUNTER — Other Ambulatory Visit: Payer: Self-pay

## 2022-10-28 DIAGNOSIS — N644 Mastodynia: Secondary | ICD-10-CM

## 2022-10-29 ENCOUNTER — Other Ambulatory Visit: Payer: Self-pay

## 2022-10-29 ENCOUNTER — Ambulatory Visit: Admission: RE | Admit: 2022-10-29 | Payer: Medicare HMO | Source: Ambulatory Visit

## 2022-10-29 ENCOUNTER — Ambulatory Visit
Admission: RE | Admit: 2022-10-29 | Discharge: 2022-10-29 | Disposition: A | Discharge status: Home / Self Care | Payer: Medicare HMO | Source: Ambulatory Visit

## 2022-10-29 DIAGNOSIS — N644 Mastodynia: Secondary | ICD-10-CM

## 2022-10-29 DIAGNOSIS — N6310 Unspecified lump in the right breast, unspecified quadrant: Secondary | ICD-10-CM

## 2022-10-29 DIAGNOSIS — N63 Unspecified lump in unspecified breast: Secondary | ICD-10-CM | POA: Diagnosis not present

## 2022-11-04 ENCOUNTER — Ambulatory Visit
Admission: RE | Admit: 2022-11-04 | Discharge: 2022-11-04 | Disposition: A | Discharge status: Home / Self Care | Payer: Medicare HMO | Source: Ambulatory Visit

## 2022-11-04 DIAGNOSIS — N644 Mastodynia: Secondary | ICD-10-CM

## 2022-11-04 DIAGNOSIS — N6310 Unspecified lump in the right breast, unspecified quadrant: Secondary | ICD-10-CM

## 2022-11-04 DIAGNOSIS — N6315 Unspecified lump in the right breast, overlapping quadrants: Secondary | ICD-10-CM | POA: Diagnosis not present

## 2022-11-04 DIAGNOSIS — C50211 Malignant neoplasm of upper-inner quadrant of right female breast: Secondary | ICD-10-CM | POA: Diagnosis not present

## 2022-11-04 DIAGNOSIS — Z170 Estrogen receptor positive status [ER+]: Secondary | ICD-10-CM | POA: Diagnosis not present

## 2022-11-04 HISTORY — PX: BREAST BIOPSY: SHX20

## 2022-11-05 ENCOUNTER — Other Ambulatory Visit: Payer: Self-pay

## 2022-11-05 DIAGNOSIS — C50911 Malignant neoplasm of unspecified site of right female breast: Secondary | ICD-10-CM

## 2022-11-06 ENCOUNTER — Telehealth: Payer: Self-pay | Admitting: Hematology and Oncology

## 2022-11-06 NOTE — Telephone Encounter (Signed)
Spoke to patient to confirm upcoming morning [facility] clinic appointment on 9/11, paperwork will be sent via mail.   Gave location and time, also informed patient that the surgeon's office would be calling as well to get information from them similar to the packet that they will be receiving so make sure to do both.  Reminded patient that all providers will be coming to the clinic to see them HERE and if they had any questions to not hesitate to reach back out to myself or their navigators.

## 2022-11-07 ENCOUNTER — Other Ambulatory Visit: Payer: Self-pay

## 2022-11-07 ENCOUNTER — Ambulatory Visit
Admission: RE | Admit: 2022-11-07 | Discharge: 2022-11-07 | Disposition: A | Discharge status: Home / Self Care | Payer: Medicare HMO | Source: Ambulatory Visit

## 2022-11-07 DIAGNOSIS — C50911 Malignant neoplasm of unspecified site of right female breast: Secondary | ICD-10-CM

## 2022-11-07 DIAGNOSIS — Z853 Personal history of malignant neoplasm of breast: Secondary | ICD-10-CM | POA: Diagnosis not present

## 2022-11-10 ENCOUNTER — Encounter: Payer: Self-pay | Admitting: *Deleted

## 2022-11-10 DIAGNOSIS — I739 Peripheral vascular disease, unspecified: Secondary | ICD-10-CM | POA: Diagnosis not present

## 2022-11-10 DIAGNOSIS — Z170 Estrogen receptor positive status [ER+]: Secondary | ICD-10-CM | POA: Insufficient documentation

## 2022-11-10 DIAGNOSIS — L603 Nail dystrophy: Secondary | ICD-10-CM | POA: Diagnosis not present

## 2022-11-10 DIAGNOSIS — E1151 Type 2 diabetes mellitus with diabetic peripheral angiopathy without gangrene: Secondary | ICD-10-CM | POA: Diagnosis not present

## 2022-11-10 DIAGNOSIS — L84 Corns and callosities: Secondary | ICD-10-CM | POA: Diagnosis not present

## 2022-11-11 NOTE — Progress Notes (Signed)
Radiation Oncology         [phone] ________________________________  Initial Outpatient Consultation  Name: Melissa Glenn MRN: [id number]  Date: 11/12/2022  DOB: Jan 16, 1931  ON:GEXBM, Zollie Beckers, MD  Emelia Loron, MD   REFERRING PHYSICIAN: Emelia Loron, MD  DIAGNOSIS: No diagnosis found.   Cancer Staging  No matching staging information was found for the patient.  Stage *** Right Breast UOQ, Invasive Ductal Carcinoma, ER+ / PR+ / Her2-, Grade 3  CHIEF COMPLAINT: Here to discuss management of right breast cancer  HISTORY OF PRESENT ILLNESS::Melissa Glenn is a 89 y.o. female who was found to have a new palpable right breast lump by her PCP. She accordingly presented for a right breast diagnostic mammogram and right breast ultrasound on 10/29/22 which demonstrated a highly suspicious mass in the 12 o'clock right breast, measuring 3.4 cm, correlating with the palpable site of concern. No evidence of right axillary lymphadenopathy was appreciated. Of note: the patient also reported some focal pain in the 6 o'clock right breast at the time of this examination. No abnormalities were seen in this area mammographically or sonographically.   Biopsy of the 12 o'clock right breast on date of 11/04/22 showed grade 3 invasive ductal carcinoma measuring 5 mm in  the greatest linear extent of the sample.  ER status: 40% positive with weak staining intensity; PR status 10% positive with weak-moderate staining intensity; Proliferation marker Ki67 at 70%; Her2 status negative; Grade 3. No lymph nodes were examined.   Other pertinent imaging performed thus far includes a left breast diagnostic mammogram on 11/07/22 which showed no evidence of malignancy in the left breast.   ***  PREVIOUS RADIATION THERAPY: No  PAST MEDICAL HISTORY:  has a past medical history of Abnormality of left breast on screening mammogram (01/04/2019), Arthritis, Cancer (HCC), Complication of anesthesia  (07-19-12), Diabetes mellitus, GERD (gastroesophageal reflux disease), High cholesterol, Hypertension, Postmenopausal, Varicose veins of leg with swelling (2012), and Vitamin D deficiency.    PAST SURGICAL HISTORY: Past Surgical History:  Procedure Laterality Date   APPENDECTOMY  1965   BREAST BIOPSY Right 11/04/2022   [place] RT BREAST BX W LOC DEV 1ST LESION IMG BX SPEC US GUIDE 11/04/2022 [facility] MAMMOGR[facility]   BREAST LUMPECTOMY WITH RADIOACTIVE SEED LOCALIZATION Left 01/04/2019   Procedure: LEFT BREAST LUMPECTOMY WITH RADIOACTIVE SEED LOCALIZATION;  Surgeon: Claud Kelp, MD;  Location: [facility] OR;  Service: General;  Laterality: Left;   CATARACT EXTRACTION, BILATERAL  2007   CHOLECYSTECTOMY  1980   EYE SURGERY     KNEE ARTHROSCOPY  2008   right   PUBOVAGINAL SLING N/A 07/23/2012   Procedure: Pubo-Vaginal Solyx Sling;  Surgeon: Kathi Ludwig, MD;  Location: [facility];  Service: Urology;  Laterality: N/A;   SIGMOIDOSCOPY  2004   TONSILLECTOMY     TUBAL LIGATION  1965   VAGINAL PROLAPSE REPAIR N/A 07/23/2012   Procedure: UP HOLD LITE ANTERIOR VAULT SUSPENSION;  Surgeon: Kathi Ludwig, MD;  Location: [facility];  Service: Urology;  Laterality: N/A;    FAMILY HISTORY: family history includes Hyperlipidemia in her maternal aunt; Stroke in her maternal grandmother and mother.  SOCIAL HISTORY:  reports that she has never smoked. She has never used smokeless tobacco. She reports current alcohol use. She reports that she does not use drugs.  ALLERGIES: Lorcet [hydrocodone-acetaminophen] and Sulfa antibiotics  MEDICATIONS:  Current Outpatient Medications  Medication Sig Dispense Refill   aspirin 81 MG tablet Take 81 mg by mouth at bedtime.  cholecalciferol (VITAMIN D) 1000 UNITS tablet Take 2,000 Units by mouth daily.      glucose blood test strip 1 each by Other route as needed. Use as instructed     HYDROcodone-acetaminophen (NORCO) 5-325 MG tablet Take 1-2 tablets by mouth every 6 (six)  hours as needed for moderate pain or severe pain. 20 tablet 0   losartan (COZAAR) 50 MG tablet Take 50 mg by mouth every morning.      metFORMIN (GLUCOPHAGE) 850 MG tablet Take 850 mg by mouth 2 (two) times daily with a meal.     pravastatin (PRAVACHOL) 80 MG tablet Take 40 mg by mouth every morning.     No current facility-administered medications for this encounter.    REVIEW OF SYSTEMS: As above in HPI.   PHYSICAL EXAM:  vitals were not taken for this visit.   General: Alert and oriented, in no acute distress HEENT: Head is normocephalic. Extraocular movements are intact. Oropharynx is clear. Neck: Neck is supple, no palpable cervical or supraclavicular lymphadenopathy. Heart: Regular in rate and rhythm with no murmurs, rubs, or gallops. Chest: Clear to auscultation bilaterally, with no rhonchi, wheezes, or rales. Abdomen: Soft, nontender, nondistended, with no rigidity or guarding. Extremities: No cyanosis or edema. Lymphatics: see Neck Exam Skin: No concerning lesions. Musculoskeletal: symmetric strength and muscle tone throughout. Neurologic: Cranial nerves II through XII are grossly intact. No obvious focalities. Speech is fluent. Coordination is intact. Psychiatric: Judgment and insight are intact. Affect is appropriate. Breasts: *** . No other palpable masses appreciated in the breasts or axillae *** .    ECOG = ***  0 - Asymptomatic (Fully active, able to carry on all predisease activities without restriction)  1 - Symptomatic but completely ambulatory (Restricted in physically strenuous activity but ambulatory and able to carry out work of a light or sedentary nature. For example, light housework, office work)  2 - Symptomatic, <50% in bed during the day (Ambulatory and capable of all self care but unable to carry out any work activities. Up and about more than 50% of waking hours)  3 - Symptomatic, >50% in bed, but not bedbound (Capable of only limited self-care,  confined to bed or chair 50% or more of waking hours)  4 - Bedbound (Completely disabled. Cannot carry on any self-care. Totally confined to bed or chair)  5 - Death   Santiago Glad MM, Creech RH, Tormey DC, et al. [phone]). "Toxicity and response criteria of the [facility]". Am. Evlyn Clines. Oncol. 5 (6): 649-55   LABORATORY DATA:  Lab Results  Component Value Date   WBC 6.1 12/30/2018   HGB 13.4 12/30/2018   HCT 40.4 12/30/2018   MCV 89.2 12/30/2018   PLT 247 12/30/2018   CMP     Component Value Date/Time   NA 140 12/30/2018 1100   K 3.9 12/30/2018 1100   CL 107 12/30/2018 1100   CO2 24 12/30/2018 1100   GLUCOSE 129 (H) 12/30/2018 1100   BUN 15 12/30/2018 1100   CREATININE 0.87 12/30/2018 1100   CALCIUM 9.5 12/30/2018 1100   GFRNONAA >60 12/30/2018 1100   GFRAA >60 12/30/2018 1100         RADIOGRAPHY: MM 3D DIAGNOSTIC MAMMOGRAM UNILATERAL LEFT BREAST  Result Date: 11/07/2022 CLINICAL DATA:  89 year old female with newly diagnosed RIGHT breast cancer. Evaluate LEFT breast. EXAM: DIGITAL DIAGNOSTIC UNILATERAL LEFT MAMMOGRAM WITH TOMOSYNTHESIS AND CAD TECHNIQUE: Left digital diagnostic mammography and breast tomosynthesis was performed. The images were evaluated with  computer-aided detection. COMPARISON:  Previous exam(s). ACR Breast Density Category b: There are scattered areas of fibroglandular density. FINDINGS: Full field views of the LEFT breast demonstrate no suspicious mass, nonsurgical distortion or worrisome calcifications. Surgical changes within the LEFT breast are again noted. IMPRESSION: No evidence of LEFT breast malignancy. RECOMMENDATION: Treatment plan I have discussed the findings and recommendations with the patient. If applicable, a reminder letter will be sent to the patient regarding the next appointment. BI-RADS CATEGORY  2: Benign. Electronically Signed   By: Harmon Pier M.D.   On: 11/07/2022 13:47   [place] RT BREAST BX W LOC DEV 1ST LESION IMG BX  SPEC US GUIDE  Addendum Date: 11/06/2022   ADDENDUM REPORT: 11/06/2022 09:06 ADDENDUM: Pathology revealed GRADE III INVASIVE POORLY DIFFERENTIATED DUCTAL ADENOCARCINOMA, FIBROCYSTIC CHANGES INCLUDING STROMAL FIBROSIS AND CYSTIC DILATATION OF DUCTS of the RIGHT breast, 12 o'clock, (coil clip). This was found to be concordant by Dr. Laveda Abbe. Pathology results were discussed with the patient by telephone. The patient reported doing well after the biopsy with tenderness at the site. Post biopsy instructions and care were reviewed and questions were answered. The patient was encouraged to call [facility] for any additional concerns. My direct phone number was provided. The patient was referred to [facility] [address] on November 12, 2022. The patient is scheduled to return for LEFT diagnostic mammography on November 07, 2022. Pathology results reported by Rene Kocher, RN on 11/06/2022. Electronically Signed   By: Harmon Pier M.D.   On: 11/06/2022 09:06   Result Date: 11/06/2022 CLINICAL DATA:  89 year old female presents for tissue sampling of UPPER RIGHT breast mass. EXAM: ULTRASOUND GUIDED RIGHT BREAST CORE NEEDLE BIOPSY COMPARISON:  Previous exam(s). PROCEDURE: I met with the patient and we discussed the procedure of ultrasound-guided biopsy, including benefits and alternatives. We discussed the high likelihood of a successful procedure. We discussed the risks of the procedure, including infection, bleeding, tissue injury, clip migration, and inadequate sampling. Informed written consent was given. The usual time-out protocol was performed immediately prior to the procedure. Using sterile technique and 1% Lidocaine as local anesthetic, under direct ultrasound visualization, a 12 gauge spring-loaded device was used to perform biopsy of the 3.4 cm mass at the 12 o'clock position of the RIGHT breast using a LATERAL approach.  At the conclusion of the procedure a COIL shaped tissue marker clip was deployed into the biopsy cavity. Follow up 2 view mammogram was performed and dictated separately. IMPRESSION: Ultrasound guided biopsy of 3.4 cm UPPER RIGHT breast mass. No apparent complications. Electronically Signed: By: Harmon Pier M.D. On: 11/04/2022 13:36   MM CLIP PLACEMENT RIGHT  Result Date: 11/04/2022 CLINICAL DATA:  Evaluate COIL biopsy clip placement following ultrasound-guided RIGHT breast biopsy. EXAM: 3D DIAGNOSTIC RIGHT MAMMOGRAM POST ULTRASOUND BIOPSY COMPARISON:  Previous exam(s). FINDINGS: 3D Mammographic images were obtained following ultrasound guided biopsy of the 3.4 cm mass at the 12 o'clock position of the RIGHT breast. The COIL biopsy marking clip is in expected position at the site of biopsy. IMPRESSION: Appropriate positioning of the COIL shaped biopsy marking clip at the site of biopsy in the UPPER RIGHT breast. Final Assessment: Post Procedure Mammograms for Marker Placement Electronically Signed   By: Harmon Pier M.D.   On: 11/04/2022 13:40   MM 3D DIAGNOSTIC MAMMOGRAM UNILATERAL RIGHT BREAST  Addendum Date: 11/04/2022   ADDENDUM REPORT: 11/04/2022 07:54 ADDENDUM: 89 year old female presenting for  evaluation of a new lump felt by her provider in her right breast. Electronically Signed   By: Emmaline Kluver M.D.   On: 11/04/2022 07:54   Addendum Date: 10/29/2022   ADDENDUM REPORT: 10/29/2022 13:17 CLINICAL DATA:  89 year old female presenting for evaluation of a new lump felt by her provider in the right breast. Electronically Signed   By: Emmaline Kluver M.D.   On: 10/29/2022 13:17   Result Date: 11/04/2022 CLINICAL DATA:  89 year old female presenting for biopsy of a new lump felt by her provider in the right breast. EXAM: DIGITAL DIAGNOSTIC UNILATERAL RIGHT MAMMOGRAM WITH TOMOSYNTHESIS AND CAD; ULTRASOUND RIGHT BREAST LIMITED TECHNIQUE: Right digital diagnostic mammography and breast tomosynthesis  was performed. The images were evaluated with computer-aided detection. ; Targeted ultrasound examination of the right breast was performed COMPARISON:  Previous exam(s). ACR Breast Density Category c: The breasts are heterogeneously dense, which may obscure small masses. FINDINGS: Mammogram: Right breast: A skin BB marks the palpable site of concern reported by the patient in the upper right breast. A spot tangential view of this area was performed in addition to standard views. At the palpable site there is an irregular mass with associated distortion measuring approximately 2.7 cm. No additional suspicious findings elsewhere in the right breast. On physical exam at the site of concern in the superior right breast I feel a fixed discrete mass. Ultrasound: Targeted ultrasound is performed at the palpable site of concern in the right breast at 12 o'clock 4 cm from the nipple demonstrating an irregular hypoechoic mass measuring 3.4 x 1.5 x 2.8 cm. Patient additionally reports some focal pain at 6 o'clock and targeted ultrasound of this area demonstrates no cystic or solid mass. Targeted ultrasound of the right axilla demonstrates normal lymph nodes. IMPRESSION: Highly suspicious mass at the palpable site of concern in the right breast at 12 o'clock measuring 3.4 cm. RECOMMENDATION: Ultrasound-guided core needle biopsy x1 of the right breast. I have discussed the findings and recommendations with the patient. If applicable, a reminder letter will be sent to the patient regarding the next appointment. BI-RADS CATEGORY  5: Highly suggestive of malignancy. Electronically Signed: By: Emmaline Kluver M.D. On: 10/29/2022 10:38   [place] LIMITED ULTRASOUND INCLUDING AXILLA RIGHT BREAST  Addendum Date: 11/04/2022   ADDENDUM REPORT: 11/04/2022 07:54 ADDENDUM: 89 year old female presenting for evaluation of a new lump felt by her provider in her right breast. Electronically Signed   By: Emmaline Kluver M.D.   On: 11/04/2022  07:54   Addendum Date: 10/29/2022   ADDENDUM REPORT: 10/29/2022 13:17 CLINICAL DATA:  89 year old female presenting for evaluation of a new lump felt by her provider in the right breast. Electronically Signed   By: Emmaline Kluver M.D.   On: 10/29/2022 13:17   Result Date: 11/04/2022 CLINICAL DATA:  89 year old female presenting for biopsy of a new lump felt by her provider in the right breast. EXAM: DIGITAL DIAGNOSTIC UNILATERAL RIGHT MAMMOGRAM WITH TOMOSYNTHESIS AND CAD; ULTRASOUND RIGHT BREAST LIMITED TECHNIQUE: Right digital diagnostic mammography and breast tomosynthesis was performed. The images were evaluated with computer-aided detection. ; Targeted ultrasound examination of the right breast was performed COMPARISON:  Previous exam(s). ACR Breast Density Category c: The breasts are heterogeneously dense, which may obscure small masses. FINDINGS: Mammogram: Right breast: A skin BB marks the palpable site of concern reported by the patient in the upper right breast. A spot tangential view of this area was performed in addition to standard views. At the palpable site there  is an irregular mass with associated distortion measuring approximately 2.7 cm. No additional suspicious findings elsewhere in the right breast. On physical exam at the site of concern in the superior right breast I feel a fixed discrete mass. Ultrasound: Targeted ultrasound is performed at the palpable site of concern in the right breast at 12 o'clock 4 cm from the nipple demonstrating an irregular hypoechoic mass measuring 3.4 x 1.5 x 2.8 cm. Patient additionally reports some focal pain at 6 o'clock and targeted ultrasound of this area demonstrates no cystic or solid mass. Targeted ultrasound of the right axilla demonstrates normal lymph nodes. IMPRESSION: Highly suspicious mass at the palpable site of concern in the right breast at 12 o'clock measuring 3.4 cm. RECOMMENDATION: Ultrasound-guided core needle biopsy x1 of the right  breast. I have discussed the findings and recommendations with the patient. If applicable, a reminder letter will be sent to the patient regarding the next appointment. BI-RADS CATEGORY  5: Highly suggestive of malignancy. Electronically Signed: By: Emmaline Kluver M.D. On: 10/29/2022 10:38      IMPRESSION/PLAN: ***   It was a pleasure meeting the patient today. We discussed the risks, benefits, and side effects of radiotherapy. I recommend radiotherapy to the *** to reduce her risk of locoregional recurrence by 2/3.  We discussed that radiation would take approximately *** weeks to complete and that I would give the patient a few weeks to heal following surgery before starting treatment planning. *** If chemotherapy were to be given, this would precede radiotherapy. We spoke about acute effects including skin irritation and fatigue as well as much less common late effects including internal organ injury or irritation. We spoke about the latest technology that is used to minimize the risk of late effects for patients undergoing radiotherapy to the breast or chest wall. No guarantees of treatment were given. The patient is enthusiastic about proceeding with treatment. I look forward to participating in the patient's care.  I will await her referral back to me for postoperative follow-up and eventual CT simulation/treatment planning.  On date of service, in total, I spent *** minutes on this encounter. Patient was seen in person.   __________________________________________   Lonie Peak, MD  This document serves as a record of services personally performed by Lonie Peak, MD. It was created on her behalf by Neena Rhymes, a trained medical scribe. The creation of this record is based on the scribe's personal observations and the provider's statements to them. This document has been checked and approved by the attending provider.

## 2022-11-12 ENCOUNTER — Encounter: Payer: Self-pay | Admitting: *Deleted

## 2022-11-12 ENCOUNTER — Inpatient Hospital Stay: Payer: Medicare HMO | Admitting: Hematology and Oncology

## 2022-11-12 ENCOUNTER — Inpatient Hospital Stay (HOSPITAL_BASED_OUTPATIENT_CLINIC_OR_DEPARTMENT_OTHER): Payer: Medicare HMO | Admitting: Genetic Counselor

## 2022-11-12 ENCOUNTER — Encounter: Payer: Self-pay | Admitting: Radiation Oncology

## 2022-11-12 ENCOUNTER — Inpatient Hospital Stay: Payer: Medicare HMO | Attending: Hematology and Oncology | Admitting: Hematology and Oncology

## 2022-11-12 ENCOUNTER — Ambulatory Visit
Admission: RE | Admit: 2022-11-12 | Discharge: 2022-11-12 | Disposition: A | Discharge status: Home / Self Care | Payer: Medicare HMO | Source: Ambulatory Visit | Attending: Radiation Oncology | Admitting: Radiation Oncology

## 2022-11-12 ENCOUNTER — Ambulatory Visit: Payer: Medicare HMO | Admitting: Physical Therapy

## 2022-11-12 VITALS — BP 140/53 | HR 70 | Temp 97.7°F | Resp 15 | Wt 139.9 lb

## 2022-11-12 DIAGNOSIS — Z170 Estrogen receptor positive status [ER+]: Secondary | ICD-10-CM

## 2022-11-12 DIAGNOSIS — Z823 Family history of stroke: Secondary | ICD-10-CM | POA: Insufficient documentation

## 2022-11-12 DIAGNOSIS — C50411 Malignant neoplasm of upper-outer quadrant of right female breast: Secondary | ICD-10-CM | POA: Insufficient documentation

## 2022-11-12 DIAGNOSIS — M546 Pain in thoracic spine: Secondary | ICD-10-CM | POA: Diagnosis not present

## 2022-11-12 LAB — CBC WITH DIFFERENTIAL (CANCER CENTER ONLY)
Abs Immature Granulocytes: 0.01 10*3/uL (ref 0.00–0.07)
Basophils Absolute: 0.1 10*3/uL (ref 0.0–0.1)
Basophils Relative: 1 %
Eosinophils Absolute: 0.3 10*3/uL (ref 0.0–0.5)
Eosinophils Relative: 6 %
HCT: 36.6 % (ref 36.0–46.0)
Hemoglobin: 12 g/dL (ref 12.0–15.0)
Immature Granulocytes: 0 %
Lymphocytes Relative: 25 %
Lymphs Abs: 1.4 10*3/uL (ref 0.7–4.0)
MCH: 28.5 pg (ref 26.0–34.0)
MCHC: 32.8 g/dL (ref 30.0–36.0)
MCV: 86.9 fL (ref 80.0–100.0)
Monocytes Absolute: 0.5 10*3/uL (ref 0.1–1.0)
Monocytes Relative: 9 %
Neutro Abs: 3.3 10*3/uL (ref 1.7–7.7)
Neutrophils Relative %: 59 %
Platelet Count: 220 10*3/uL (ref 150–400)
RBC: 4.21 MIL/uL (ref 3.87–5.11)
RDW: 13.1 % (ref 11.5–15.5)
WBC Count: 5.6 10*3/uL (ref 4.0–10.5)
nRBC: 0 % (ref 0.0–0.2)

## 2022-11-12 LAB — CMP (CANCER CENTER ONLY)
ALT: 8 U/L (ref 0–44)
AST: 11 U/L — ABNORMAL LOW (ref 15–41)
Albumin: 4.4 g/dL (ref 3.5–5.0)
Alkaline Phosphatase: 66 U/L (ref 38–126)
Anion gap: 7 (ref 5–15)
BUN: 13 mg/dL (ref 8–23)
CO2: 28 mmol/L (ref 22–32)
Calcium: 9.7 mg/dL (ref 8.9–10.3)
Chloride: 107 mmol/L (ref 98–111)
Creatinine: 0.9 mg/dL (ref 0.44–1.00)
GFR, Estimated: >60 mL/min (ref >60)
Glucose, Bld: 111 mg/dL — ABNORMAL HIGH (ref 70–99)
Potassium: 4.1 mmol/L (ref 3.5–5.1)
Sodium: 142 mmol/L (ref 135–145)
Total Bilirubin: 0.5 mg/dL (ref 0.3–1.2)
Total Protein: 6.6 g/dL (ref 6.5–8.1)

## 2022-11-12 LAB — GENETIC SCREENING ORDER

## 2022-11-12 NOTE — Progress Notes (Signed)
[facility] CONSULT NOTE  Patient Care Team: Merri Brunette, MD as PCP - General (Internal Medicine) Pershing Proud, RN as Oncology Nurse Navigator Donnelly Angelica, RN as Oncology Nurse Navigator Emelia Loron, MD as Consulting Physician (General Surgery) Rachel Moulds, MD as Consulting Physician (Hematology and Oncology) Lonie Peak, MD as Attending Physician (Radiation Oncology)  CHIEF COMPLAINTS/PURPOSE OF CONSULTATION:  Newly diagnosed breast cancer  ASSESSMENT & PLAN:  Malignant neoplasm of upper-outer quadrant of right breast in female, estrogen receptor positive [facility]) This is a very pleasant 89 year old female patient with newly diagnosed right breast grade 3 IDC, ER 40% positive weak staining, PR 10% positive weak to moderate staining, Ki-67 of 70% and HER2 1+ referred to breast MDC for recommendations.  Patient is a robust 89 and has been physically active, she is a retired Engineer, civil (consulting) as well.  No concerns on review of systems.  Physical examination with right breast upper outer quadrant mass measuring approximately 3-1/2 cm with no palpable regional adenopathy.  Left breast has chronically inverted nipple since her last biopsy.  Given near triple negative tumor, we have discussed about considering upfront surgery followed by adjuvant radiation.  Although chemotherapy would have been standard of care for near triple negative/functional triple negative tumors, given her age, we have discussed that chemotherapy may not be tolerated very well and she is in complete agreement with this.  If repeat prognostics still show ER/PR positive staining, we will consider antiestrogen therapy.  She understands the role of antiestrogen therapy.  All her questions were answered to the best of my knowledge.  Thank you for consulting [place] in the care of this patient.  Please do not hesitate to contact us with any additional questions or concerns.  No orders of the defined types were placed  in this encounter.    HISTORY OF PRESENTING ILLNESS:  Melissa Glenn 89 y.o. female is here because of newly diagnosed breast cancer  This is a very pleasant 89 year old female patient with past medical history significant for type 2 diabetes mellitus, hypertension referred to breast MDC for new diagnosis of right-sided breast cancer.  Patient arrived to the appointment by herself.  She lives independently, has 1 child in town and has 3 other children living close by. She tells me that she has not noticed this lump.  She went to her PCP for another cyst in the breast and they have found his large breast mass and this prompted further investigation.  8/28 she had diagnostic mammogram and this showed highly suspicious mass at the palpable site of concern in the right breast at 12:00 measuring 3.4 cm.  Ultrasound confirmed this findings.  Pathology from the right breast showed invasive poorly differentiated ductal adenocarcinoma, grade 3, prognostic showed ER 40% positive weak staining, PR 10% positive weak to moderate staining, Ki-67 of 70% and HER2 1+  Bedside the breast lump, she has some back pain in the upper back and has been working with chiropractor, also reports some restless leg syndrome or neuropathy, takes ropinirole.  Rest of the pertinent 10 point ROS reviewed and negative.  She had 6 children, breast-fed all of them, lost 2 children.  She denies any hormone replacement or contraception.  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  Rest of the pertinent 10 point ROS reviewed and negative  MEDICAL HISTORY:  Past Medical History:  Diagnosis Date   Abnormality of left breast on screening mammogram 01/04/2019   Arthritis  mild -generalized   Cancer (HCC)    Complication of anesthesia 07-19-12   note with chart DOS 01-11-86, "small mouth"   Diabetes mellitus    TYPE 2   GERD (gastroesophageal reflux disease)    High cholesterol    Hypertension    Postmenopausal     Varicose veins of leg with swelling 2012   bilateral lower extremity Varicose Veins w/ swelling/pain   Vitamin D deficiency     SURGICAL HISTORY: Past Surgical History:  Procedure Laterality Date   APPENDECTOMY  1965   BREAST BIOPSY Right 11/04/2022   [place] RT BREAST BX W LOC DEV 1ST LESION IMG BX SPEC US GUIDE 11/04/2022 [facility] MAMMOGRAPHY   BREAST LUMPECTOMY WITH RADIOACTIVE SEED LOCALIZATION Left 01/04/2019   Procedure: LEFT BREAST LUMPECTOMY WITH RADIOACTIVE SEED LOCALIZATION;  Surgeon: Claud Kelp, MD;  Location: [facility] OR;  Service: General;  Laterality: Left;   CATARACT EXTRACTION, BILATERAL  2007   CHOLECYSTECTOMY  1980   EYE SURGERY     KNEE ARTHROSCOPY  2008   right   PUBOVAGINAL SLING N/A 07/23/2012   Procedure: Pubo-Vaginal Solyx Sling;  Surgeon: Kathi Ludwig, MD;  Location: [facility]S;  Service: Urology;  Laterality: N/A;   SIGMOIDOSCOPY  2004   TONSILLECTOMY     TUBAL LIGATION  1965   VAGINAL PROLAPSE REPAIR N/A 07/23/2012   Procedure: UP HOLD LITE ANTERIOR VAULT SUSPENSION;  Surgeon: Kathi Ludwig, MD;  Location: [facility];  Service: Urology;  Laterality: N/A;    SOCIAL HISTORY: Social History   Socioeconomic History   Marital status: Widowed    Spouse name: Not on file   Number of children: Not on file   Years of education: Not on file   Highest education level: Not on file  Occupational History   Not on file  Tobacco Use   Smoking status: Never   Smokeless tobacco: Never  Substance and Sexual Activity   Alcohol use: Yes    Comment: WINE-monthly   Drug use: No   Sexual activity: Never  Other Topics Concern   Not on file  Social History Narrative   Not on file   Social Determinants of Health   Financial Resource Strain: Not on file  Food Insecurity: Not on file  Transportation Needs: Not on file  Physical Activity: Not on file  Stress: Not on file  Social Connections: Not on file  Intimate Partner Violence: Not on file    FAMILY  HISTORY: Family History  Problem Relation Age of Onset   Stroke Mother    Stroke Maternal Grandmother    Hyperlipidemia Maternal Aunt    Breast cancer Neg Hx     ALLERGIES:  is allergic to codeine, lorcet [hydrocodone-acetaminophen], and sulfa antibiotics.  MEDICATIONS:  Current Outpatient Medications  Medication Sig Dispense Refill   acetaminophen (TYLENOL) 325 MG tablet Take 650 mg by mouth every 6 (six) hours as needed for moderate pain (pain).     aspirin 81 MG tablet Take 81 mg by mouth at bedtime.      cholecalciferol (VITAMIN D) 1000 UNITS tablet Take 2,000 Units by mouth daily.      glucose blood test strip 1 each by Other route as needed. Use as instructed     losartan (COZAAR) 50 MG tablet Take 50 mg by mouth every morning.      metFORMIN (GLUCOPHAGE-XR) 500 MG 24 hr tablet Take 1,000 mg by mouth 2 (two) times daily.     pravastatin (PRAVACHOL) 80 MG tablet  Take 40 mg by mouth every morning.     rOPINIRole (REQUIP) 0.5 MG tablet Take 0.5 mg by mouth daily.     No current facility-administered medications for this visit.     PHYSICAL EXAMINATION: ECOG PERFORMANCE STATUS: 0 - Asymptomatic  Vitals:   11/12/22 0835  BP: (!) 140/53  Pulse: 70  Resp: 15  Temp: 97.7 F (36.5 C)  SpO2: 98%   Filed Weights   11/12/22 0835  Weight: 139 lb 14.4 oz (63.5 kg)    GENERAL:alert, no distress and comfortable Neck: No palpable lymphadenopathy Breast: Bilateral breast inspected and palpated.  There is a palpable mass measuring about 3-1/2 cm in the right breast visible on inspection as well.  No regional adenopathy noted. ABDOMEN:abdomen soft, non-tender and normal bowel sounds Musculoskeletal:no cyanosis of digits and no clubbing  PSYCH: alert & oriented x 3 with fluent speech NEURO: no focal motor/sensory deficits  LABORATORY DATA:  I have reviewed the data as listed Lab Results  Component Value Date   WBC 5.6 11/12/2022   HGB 12.0 11/12/2022   HCT 36.6 11/12/2022    MCV 86.9 11/12/2022   PLT 220 11/12/2022     Chemistry      Component Value Date/Time   NA 142 11/12/2022 0747   K 4.1 11/12/2022 0747   CL 107 11/12/2022 0747   CO2 28 11/12/2022 0747   BUN 13 11/12/2022 0747   CREATININE 0.90 11/12/2022 0747      Component Value Date/Time   CALCIUM 9.7 11/12/2022 0747   ALKPHOS 66 11/12/2022 0747   AST 11 (L) 11/12/2022 0747   ALT 8 11/12/2022 0747   BILITOT 0.5 11/12/2022 0747       RADIOGRAPHIC STUDIES: I have personally reviewed the radiological images as listed and agreed with the findings in the report. MM 3D DIAGNOSTIC MAMMOGRAM UNILATERAL LEFT BREAST  Result Date: 11/07/2022 CLINICAL DATA:  89 year old female with newly diagnosed RIGHT breast cancer. Evaluate LEFT breast. EXAM: DIGITAL DIAGNOSTIC UNILATERAL LEFT MAMMOGRAM WITH TOMOSYNTHESIS AND CAD TECHNIQUE: Left digital diagnostic mammography and breast tomosynthesis was performed. The images were evaluated with computer-aided detection. COMPARISON:  Previous exam(s). ACR Breast Density Category b: There are scattered areas of fibroglandular density. FINDINGS: Full field views of the LEFT breast demonstrate no suspicious mass, nonsurgical distortion or worrisome calcifications. Surgical changes within the LEFT breast are again noted. IMPRESSION: No evidence of LEFT breast malignancy. RECOMMENDATION: Treatment plan I have discussed the findings and recommendations with the patient. If applicable, a reminder letter will be sent to the patient regarding the next appointment. BI-RADS CATEGORY  2: Benign. Electronically Signed   By: Harmon Pier M.D.   On: 11/07/2022 13:47   [place] RT BREAST BX W LOC DEV 1ST LESION IMG BX SPEC US GUIDE  Addendum Date: 11/06/2022   ADDENDUM REPORT: 11/06/2022 09:06 ADDENDUM: Pathology revealed GRADE III INVASIVE POORLY DIFFERENTIATED DUCTAL ADENOCARCINOMA, FIBROCYSTIC CHANGES INCLUDING STROMAL FIBROSIS AND CYSTIC DILATATION OF DUCTS of the RIGHT breast, 12 o'clock,  (coil clip). This was found to be concordant by Dr. Laveda Abbe. Pathology results were discussed with the patient by telephone. The patient reported doing well after the biopsy with tenderness at the site. Post biopsy instructions and care were reviewed and questions were answered. The patient was encouraged to call [facility] for any additional concerns. My direct phone number was provided. The patient was referred to [facility] on November 12, 2022. The patient is scheduled to return for LEFT diagnostic mammography on November 07, 2022. Pathology results reported by Rene Kocher, RN on 11/06/2022. Electronically Signed   By: Harmon Pier M.D.   On: 11/06/2022 09:06   Result Date: 11/06/2022 CLINICAL DATA:  89 year old female presents for tissue sampling of UPPER RIGHT breast mass. EXAM: ULTRASOUND GUIDED RIGHT BREAST CORE NEEDLE BIOPSY COMPARISON:  Previous exam(s). PROCEDURE: I met with the patient and we discussed the procedure of ultrasound-guided biopsy, including benefits and alternatives. We discussed the high likelihood of a successful procedure. We discussed the risks of the procedure, including infection, bleeding, tissue injury, clip migration, and inadequate sampling. Informed written consent was given. The usual time-out protocol was performed immediately prior to the procedure. Using sterile technique and 1% Lidocaine as local anesthetic, under direct ultrasound visualization, a 12 gauge spring-loaded device was used to perform biopsy of the 3.4 cm mass at the 12 o'clock position of the RIGHT breast using a LATERAL approach. At the conclusion of the procedure a COIL shaped tissue marker clip was deployed into the biopsy cavity. Follow up 2 view mammogram was performed and dictated separately. IMPRESSION: Ultrasound guided biopsy of 3.4 cm UPPER RIGHT breast mass. No apparent complications.  Electronically Signed: By: Harmon Pier M.D. On: 11/04/2022 13:36   MM CLIP PLACEMENT RIGHT  Result Date: 11/04/2022 CLINICAL DATA:  Evaluate COIL biopsy clip placement following ultrasound-guided RIGHT breast biopsy. EXAM: 3D DIAGNOSTIC RIGHT MAMMOGRAM POST ULTRASOUND BIOPSY COMPARISON:  Previous exam(s). FINDINGS: 3D Mammographic images were obtained following ultrasound guided biopsy of the 3.4 cm mass at the 12 o'clock position of the RIGHT breast. The COIL biopsy marking clip is in expected position at the site of biopsy. IMPRESSION: Appropriate positioning of the COIL shaped biopsy marking clip at the site of biopsy in the UPPER RIGHT breast. Final Assessment: Post Procedure Mammograms for Marker Placement Electronically Signed   By: Harmon Pier M.D.   On: 11/04/2022 13:40   MM 3D DIAGNOSTIC MAMMOGRAM UNILATERAL RIGHT BREAST  Addendum Date: 11/04/2022   ADDENDUM REPORT: 11/04/2022 07:54 ADDENDUM: 89 year old female presenting for evaluation of a new lump felt by her provider in her right breast. Electronically Signed   By: Emmaline Kluver M.D.   On: 11/04/2022 07:54   Addendum Date: 10/29/2022   ADDENDUM REPORT: 10/29/2022 13:17 CLINICAL DATA:  89 year old female presenting for evaluation of a new lump felt by her provider in the right breast. Electronically Signed   By: Emmaline Kluver M.D.   On: 10/29/2022 13:17   Result Date: 11/04/2022 CLINICAL DATA:  89 year old female presenting for biopsy of a new lump felt by her provider in the right breast. EXAM: DIGITAL DIAGNOSTIC UNILATERAL RIGHT MAMMOGRAM WITH TOMOSYNTHESIS AND CAD; ULTRASOUND RIGHT BREAST LIMITED TECHNIQUE: Right digital diagnostic mammography and breast tomosynthesis was performed. The images were evaluated with computer-aided detection. ; Targeted ultrasound examination of the right breast was performed COMPARISON:  Previous exam(s). ACR Breast Density Category c: The breasts are heterogeneously dense, which may obscure small  masses. FINDINGS: Mammogram: Right breast: A skin BB marks the palpable site of concern reported by the patient in the upper right breast. A spot tangential view of this area was performed in addition to standard views. At the palpable site there is an irregular mass with associated distortion measuring approximately 2.7 cm. No additional suspicious findings elsewhere in the right breast. On physical exam at the site of concern in the superior right breast I feel a fixed discrete  mass. Ultrasound: Targeted ultrasound is performed at the palpable site of concern in the right breast at 12 o'clock 4 cm from the nipple demonstrating an irregular hypoechoic mass measuring 3.4 x 1.5 x 2.8 cm. Patient additionally reports some focal pain at 6 o'clock and targeted ultrasound of this area demonstrates no cystic or solid mass. Targeted ultrasound of the right axilla demonstrates normal lymph nodes. IMPRESSION: Highly suspicious mass at the palpable site of concern in the right breast at 12 o'clock measuring 3.4 cm. RECOMMENDATION: Ultrasound-guided core needle biopsy x1 of the right breast. I have discussed the findings and recommendations with the patient. If applicable, a reminder letter will be sent to the patient regarding the next appointment. BI-RADS CATEGORY  5: Highly suggestive of malignancy. Electronically Signed: By: Emmaline Kluver M.D. On: 10/29/2022 10:38   [place] LIMITED ULTRASOUND INCLUDING AXILLA RIGHT BREAST  Addendum Date: 11/04/2022   ADDENDUM REPORT: 11/04/2022 07:54 ADDENDUM: 89 year old female presenting for evaluation of a new lump felt by her provider in her right breast. Electronically Signed   By: Emmaline Kluver M.D.   On: 11/04/2022 07:54   Addendum Date: 10/29/2022   ADDENDUM REPORT: 10/29/2022 13:17 CLINICAL DATA:  89 year old female presenting for evaluation of a new lump felt by her provider in the right breast. Electronically Signed   By: Emmaline Kluver M.D.   On: 10/29/2022 13:17    Result Date: 11/04/2022 CLINICAL DATA:  89 year old female presenting for biopsy of a new lump felt by her provider in the right breast. EXAM: DIGITAL DIAGNOSTIC UNILATERAL RIGHT MAMMOGRAM WITH TOMOSYNTHESIS AND CAD; ULTRASOUND RIGHT BREAST LIMITED TECHNIQUE: Right digital diagnostic mammography and breast tomosynthesis was performed. The images were evaluated with computer-aided detection. ; Targeted ultrasound examination of the right breast was performed COMPARISON:  Previous exam(s). ACR Breast Density Category c: The breasts are heterogeneously dense, which may obscure small masses. FINDINGS: Mammogram: Right breast: A skin BB marks the palpable site of concern reported by the patient in the upper right breast. A spot tangential view of this area was performed in addition to standard views. At the palpable site there is an irregular mass with associated distortion measuring approximately 2.7 cm. No additional suspicious findings elsewhere in the right breast. On physical exam at the site of concern in the superior right breast I feel a fixed discrete mass. Ultrasound: Targeted ultrasound is performed at the palpable site of concern in the right breast at 12 o'clock 4 cm from the nipple demonstrating an irregular hypoechoic mass measuring 3.4 x 1.5 x 2.8 cm. Patient additionally reports some focal pain at 6 o'clock and targeted ultrasound of this area demonstrates no cystic or solid mass. Targeted ultrasound of the right axilla demonstrates normal lymph nodes. IMPRESSION: Highly suspicious mass at the palpable site of concern in the right breast at 12 o'clock measuring 3.4 cm. RECOMMENDATION: Ultrasound-guided core needle biopsy x1 of the right breast. I have discussed the findings and recommendations with the patient. If applicable, a reminder letter will be sent to the patient regarding the next appointment. BI-RADS CATEGORY  5: Highly suggestive of malignancy. Electronically Signed: By: Emmaline Kluver  M.D. On: 10/29/2022 10:38    All questions were answered. The patient knows to call the clinic with any problems, questions or concerns. I spent 45 minutes in the care of this patient including H and P, review of records, counseling and coordination of care.     Rachel Moulds, MD 11/12/2022 10:07 AM

## 2022-11-12 NOTE — Progress Notes (Signed)
REFERRING PROVIDER: Rachel Moulds, MD [address] [place],  [place] [address]  PRIMARY PROVIDER:  Merri Brunette, MD  PRIMARY REASON FOR VISIT:  1. Malignant neoplasm of upper-outer quadrant of right breast in female, estrogen receptor positive (HCC)     HISTORY OF PRESENT ILLNESS:   Melissa Glenn, a 89 y.o. female, was seen for a [place] cancer genetics consultation at the request of Dr. Al Pimple due to a personal history of breast cancer.  Melissa Glenn presents to clinic today to discuss the possibility of a hereditary predisposition to cancer, to discuss genetic testing, and to further clarify her future cancer risks, as well as potential cancer risks for family members.   In September 2024, at the age of 15, Melissa Glenn was diagnosed with invasive ductal carcinoma of the right breast (ER 40% positive weak staining, PR 10% positive weak to moderate staining, Ki-67 of 70% and HER2 negative). The treatment plan is pending.     Past Medical History:  Diagnosis Date   Abnormality of left breast on screening mammogram 01/04/2019   Arthritis    mild -generalized   Cancer (HCC)    Complication of anesthesia 07-19-12   note with chart DOS [birth year], "small mouth"   Diabetes mellitus    TYPE 2   GERD (gastroesophageal reflux disease)    High cholesterol    Hypertension    Postmenopausal    Varicose veins of leg with swelling 2012   bilateral lower extremity Varicose Veins w/ swelling/pain   Vitamin D deficiency     Past Surgical History:  Procedure Laterality Date   APPENDECTOMY  1965   BREAST BIOPSY Right 11/04/2022   [place] RT BREAST BX W LOC DEV 1ST LESION IMG BX SPEC US GUIDE 11/04/2022 [facility]MO[facility]   BREAST LUMPECTOMY WITH RADIOACTIVE SEED LOCALIZATION Left 01/04/2019   Procedure: LEFT BREAST LUMPECTOMY WITH RADIOACTIVE SEED LOCALIZATION;  Surgeon: Claud Kelp, MD;  Location: [facility] OR;  Service: General;  Laterality: Left;   CATARACT EXTRACTION, BILATERAL  2007   CHOLECYSTECTOMY   1980   EYE SURGERY     KNEE ARTHROSCOPY  2008   right   PUBOVAGINAL SLING N/A 07/23/2012   Procedure: Pubo-Vaginal Solyx Sling;  Surgeon: Kathi Ludwig, MD;  Location: [facility];  Service: Urology;  Laterality: N/A;   SIGMOIDOSCOPY  2004   TONSILLECTOMY     TUBAL LIGATION  1965   VAGINAL PROLAPSE REPAIR N/A 07/23/2012   Procedure: UP HOLD LITE ANTERIOR VAULT SUSPENSION;  Surgeon: Kathi Ludwig, MD;  Location: [facility];  Service: Urology;  Laterality: N/A;    FAMILY HISTORY:  We obtained a detailed family history.  She did not report any known family history of cancer.  However, she does not have any information about paternal relatives.     Melissa Glenn is unaware of previous family history of genetic testing for hereditary cancer risks.   There is no reported Ashkenazi Jewish ancestry. There is no known consanguinity.  GENETIC COUNSELING ASSESSMENT: Melissa Glenn is a 89 y.o. female with a personal history of breast cancer and limited information about family history.  We, therefore, discussed and recommended the following at today's visit.   DISCUSSION:  We discussed that, in general, most cancer is not inherited in families, but instead is sporadic or familial. Sporadic cancers occur by chance and typically happen at older ages (>50 years) as this type of cancer is caused by genetic changes acquired during an individual's lifetime. Some families have more cancers  than would be expected by chance; however, the ages or types of cancer are not consistent with a known genetic mutation or known genetic mutations have been ruled out. This type of familial cancer is thought to be due to a combination of multiple genetic, environmental, hormonal, and lifestyle factors. While this combination of factors likely increases the risk of cancer, the exact source of this risk is not currently identifiable or testable.    We discussed that approximately 5-10% of cancer is hereditary, meaning that it  is due to a mutation in a single gene that is passed down from generation to generation in a family. Most hereditary cases of breast cancer are associated with mutations in BRCA1/2. There are other genes that can be associated an increased risk for breast cancer, including but not limited to ATM, CHEK2, and PALB2. We discussed that testing can be beneficial for several reasons, including knowing about other cancer risks, identifying potential screening and risk-reduction options that may be appropriate, and to understand if other family members could be at risk for cancer and allow them to undergo genetic testing.  We discussed with Melissa Glenn that the personal and family history does not meet insurance or NCCN criteria for genetic testing.  We discussed the possibility that her breast cancer could be considered 'functionally triple negative' by final pathology after surgery, at which point genetic testing would be recommended.  We also discussed that given she does not have information about any paternal relatives, genetic testing is reasonable.  She meets criteria for genetic testing through the [employer].  Melissa Glenn was interested in proceeding with testing.    We reviewed the characteristics, features and inheritance patterns of hereditary cancer syndromes. We also discussed the process of testing, insurance coverage and turn-around-time for results. We discussed the implications of a negative, positive, and variant of uncertain significant result. We recommended Melissa Glenn pursue genetic testing for a panel that contains genes associated with [facility] CancerNext+RNAinsight Panel.  The [facility] CancerNext+RNAinsight Panel includes sequencing, rearrangement analysis, and RNA analysis for the following 34 genes: APC, ATM, BARD1, BMPR1A, BRCA1, BRCA2, BRIP1, CDH1, CDK4, CDKN2A, CHEK2, DICER1, MLH1, MSH2, MSH6, MUTYH, NF1, NTHL1, PALB2, PMS2, PTEN, RAD51C, RAD51D, SMAD4, SMARCA4, STK11  and TP53 (sequencing and deletion/duplication); AXIN2, HOXB13, MSH3, POLD1 and POLE (sequencing only); EPCAM and GREM1 (deletion/duplication only).  We discussed that she may have an out of pocket cost. We discussed that if her out of pocket cost for testing is over $100, the laboratory should contact them to discuss self-pay options and/or patient pay assistance programs.   PLAN: After considering the risks, benefits, and limitations, Melissa Glenn provided informed consent to pursue genetic testing and the blood sample was sent to [facility] for analysis of the CancerNext +RNAinsight Panel. Results should be available within approximately 2-3 weeks' time, at which point they will be disclosed by telephone to Melissa Glenn, as will any additional recommendations warranted by these results. Melissa Glenn will receive a summary of her genetic counseling visit and a copy of her results once available. This information will also be available in Epic.    Melissa Glenn questions were answered to her satisfaction today. Our contact information was provided should additional questions or concerns arise. Thank you for the referral and allowing [place] to share in the care of your patient.   Eashan Schipani M. Rennie Plowman, MS, [facility] Genetic Counselor [email or web address] (P) [phone]   The patient was seen for less than 15 minutes of face-to-face  genetic counseling.

## 2022-11-12 NOTE — Assessment & Plan Note (Signed)
This is a very pleasant 89 year old female patient with newly diagnosed right breast grade 3 IDC, ER 40% positive weak staining, PR 10% positive weak to moderate staining, Ki-67 of 70% and HER2 1+ referred to breast [facility] for recommendations.  Patient is a robust 89 and has been physically active, she is a retired Engineer, civil (consulting) as well.  No concerns on review of systems.  Physical examination with right breast upper outer quadrant mass measuring approximately 3-1/2 cm with no palpable regional adenopathy.  Left breast has chronically inverted nipple since her last biopsy.  Given near triple negative tumor, we have discussed about considering upfront surgery followed by adjuvant radiation.  Although chemotherapy would have been standard of care for near triple negative/functional triple negative tumors, given her age, we have discussed that chemotherapy may not be tolerated very well and she is in complete agreement with this.  If repeat prognostics still show ER/PR positive staining, we will consider antiestrogen therapy.  She understands the role of antiestrogen therapy.  All her questions were answered to the best of my knowledge.  Thank you for consulting [place] in the care of this patient.  Please do not hesitate to contact us with any additional questions or concerns.

## 2022-11-19 ENCOUNTER — Encounter (HOSPITAL_COMMUNITY): Payer: Self-pay | Admitting: General Surgery

## 2022-11-19 ENCOUNTER — Other Ambulatory Visit: Payer: Self-pay

## 2022-11-19 ENCOUNTER — Other Ambulatory Visit: Payer: Self-pay | Admitting: General Surgery

## 2022-11-19 NOTE — H&P (Signed)
89 yof with prior benign biopsy who presents after palpable mass found by pcp. She has no dc. 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 She has c density breast tissue. She was noted to have a right breast mass. On [place] this is a 3.4x1.5x2.8 cm mass. Ax [place] is negative.biopsy is a III IDC that is 40% er pos, 10% pr pos, her 2 negative and Ki is 70%. She is here to discuss options  Review of Systems: A complete review of systems was obtained from the patient. I have reviewed this information and discussed as appropriate with the patient. See HPI as well for other ROS.  Review of Systems  HENT: Positive for hearing loss.  Cardiovascular: Positive for leg swelling (foot).  Musculoskeletal: Positive for back pain and joint pain.  Skin: Positive for rash.  Psychiatric/Behavioral: Positive for depression.  All other systems reviewed and are negative.   Medical History: Past Medical History:  Diagnosis Date  Anemia  Anxiety  Arthritis  Diabetes mellitus without complication (CMS/HHS-HCC)  GERD (gastroesophageal reflux disease)  History of cancer   Patient Active Problem List  Diagnosis  Malignant neoplasm of upper-outer quadrant of right breast in female, estrogen receptor positive (CMS/HHS-HCC)  Type II diabetes mellitus (CMS/HHS-HCC)   Past Surgical History:  Procedure Laterality Date  Breast Lumpectomy Left 01/04/2019  Breast Biopsy Right Right 11/04/2022  APPENDECTOMY N/A  1965  CHOLECYSTECTOMY N/A  1980   Allergies  Allergen Reactions  Hydrocodone-Acetaminophen Nausea And Vomiting  Sulfa (Sulfonamide Antibiotics) Other (See Comments) and Tinnitus  DIZZINESS   Current Outpatient Medications on File Prior to Visit  Medication Sig Dispense Refill  aspirin 81 MG EC tablet Take 81 mg by mouth at bedtime  cholecalciferol (VITAMIN D3) 1000 unit tablet Take 2,000 Units by mouth once daily  metFORMIN (GLUCOPHAGE) 850 MG  tablet Take 850 mg by mouth 2 (two) times daily with meals  pravastatin (PRAVACHOL) 80 MG tablet Take 80 mg by mouth once daily    Family History  Problem Relation Age of Onset  Skin cancer Mother  Hyperlipidemia (Elevated cholesterol) Mother    Social History   Tobacco Use  Smoking Status Never  Smokeless Tobacco Never  Marital status: Widowed  Tobacco Use  Smoking status: Never  Smokeless tobacco: Never  Vaping Use  Vaping status: Never Used  Substance and Sexual Activity  Alcohol use: Yes  Drug use: Never   Objective:   Physical Exam Vitals reviewed.  Constitutional:  Appearance: Normal appearance.  Chest:  Breasts: Right: Mass present. No inverted nipple or nipple discharge.  Left: Inverted nipple present. No mass or nipple discharge.  Lymphadenopathy:  Upper Body:  Right upper body: No supraclavicular or axillary adenopathy.  Left upper body: No supraclavicular or axillary adenopathy.  Neurological:  Mental Status: She is alert.   Assessment and Plan:   Malignant neoplasm of upper-outer quadrant of right breast in female, estrogen receptor positive (CMS/HHS-HCC)  Right breast lumpectomy  We discussed the staging and pathophysiology of breast cancer. We discussed all of the different options for treatment for breast cancer including surgery, chemotherapy, radiation therapy, Herceptin, and antiestrogen therapy.  Obviously age is important here. Normally primary chemo would be plan but in discussion at [facility] will proceed with surgery first as unlikely to get chemo.  We discussed a sentinel lymph node biopsy. I have discussed with med onc and rad onc and they do not feel I need  to do node biopsy and I am fine with that given that nodes are negative on ax u/s.   We discussed the options for treatment of the breast cancer which included lumpectomy versus a mastectomy. We discussed the performance of the lumpectomy with radioactive seed placement. We discussed a  5-10% chance of a positive margin requiring reexcision in the operating room. We also discussed that she will likely need radiation therapy if she undergoes lumpectomy. We discussed mastectomy and the postoperative care for that as well. Most mastectomy patients will not need radiation therapy. We discussed that there is no difference in her survival whether she undergoes lumpectomy with radiation therapy or antiestrogen therapy versus a mastectomy. There is also no real difference between her recurrence in the breast.  We discussed the risks of operation including bleeding, infection, possible reoperation. She understands her further therapy will be based on what her stages at the time of her operation.

## 2022-11-19 NOTE — Progress Notes (Addendum)
PCP - Merri Brunette Cardiologist - denies Oncologist- Burnice Logan Iruku   PPM/ICD - denies Device Orders - n/a Rep Notified - n/a  Chest x-ray -  EKG -  Stress Test -  ECHO -  Cardiac Cath -   CPAP -   Fasting Blood Sugar - states "under 100" Checks Blood Sugar patient states "once a week, MD monitors A1c/  Blood Thinner Instructions: Denies Aspirin Instructions: evening medication  ERAS Protcol - ERAS no drink  COVID TEST- n/a  Anesthesia review: Yes Htn, "small mouth  Patient verbally denies any shortness of breath, fever, cough and chest pain during phone call   -------------  SDW INSTRUCTIONS given:  Your procedure is scheduled on Sept 19, 2024.  Report to [facility] Main Entrance "A" at 7:00 A.M., and check in at the Admitting office.  Call this number if you have problems the morning of surgery:  [phone]   Remember:  Do not eat after midnight the night before your surgery  You may drink clear liquids until 6:30 the morning of your surgery.   Clear liquids allowed are: Water, Non-Citrus Juices (without pulp), Carbonated Beverages, Clear Tea, Black Coffee Only, and Gatorade    Take these medicines the morning of surgery with A SIP OF WATER  acetaminophen (TYLENOL)   IF NEEDED   As of today, STOP taking any Aspirin (unless otherwise instructed by your surgeon) Aleve, Naproxen, Ibuprofen, Motrin, Advil, Goody's, BC's, all herbal medications, fish oil, and all vitamins.                      Do not wear jewelry, make up, or nail polish            Do not wear lotions, powders, perfumes/colognes, or deodorant.            Do not shave 48 hours prior to surgery.  Men may shave face and neck.            Do not bring valuables to the hospital.            [facility] is not responsible for any belongings or valuables.  Do NOT Smoke (Tobacco/Vaping) 24 hours prior to your procedure If you use a CPAP at night, you may bring all equipment for your overnight stay.    Contacts, glasses, dentures or bridgework may not be worn into surgery.      For patients admitted to the hospital, discharge time will be determined by your treatment team.   Patients discharged the day of surgery will not be allowed to drive home, and someone needs to stay with them for 24 hours.    Special instructions:   [facility]- Preparing For Surgery  Before surgery, you can play an important role. Because skin is not sterile, your skin needs to be as free of germs as possible. You can reduce the number of germs on your skin by washing with CHG (chlorahexidine gluconate) Soap before surgery.  CHG is an antiseptic cleaner which kills germs and bonds with the skin to continue killing germs even after washing.    Oral Hygiene is also important to reduce your risk of infection.  Remember - BRUSH YOUR TEETH THE MORNING OF SURGERY WITH YOUR REGULAR TOOTHPASTE  Please do not use if you have an allergy to CHG or antibacterial soaps. If your skin becomes reddened/irritated stop using the CHG.  Do not shave (including legs and underarms) for at least 48 hours prior to first  CHG shower. It is OK to shave your face.  Please follow these instructions carefully.   Shower the NIGHT BEFORE SURGERY and the MORNING OF SURGERY with DIAL Soap.   Pat yourself dry with a CLEAN TOWEL.  Wear CLEAN PAJAMAS to bed the night before surgery  Place CLEAN SHEETS on your bed the night of your first shower and DO NOT SLEEP WITH PETS.   Day of Surgery: Please shower morning of surgery  Wear Clean/Comfortable clothing the morning of surgery Do not apply any deodorants/lotions.   Remember to brush your teeth WITH YOUR REGULAR TOOTHPASTE.   Questions were answered. Patient verbalized understanding of instructions.

## 2022-11-20 ENCOUNTER — Encounter: Payer: Self-pay | Admitting: *Deleted

## 2022-11-20 ENCOUNTER — Other Ambulatory Visit: Payer: Self-pay

## 2022-11-20 ENCOUNTER — Observation Stay (HOSPITAL_COMMUNITY)
Admission: RE | Admit: 2022-11-20 | Discharge: 2022-11-21 | Disposition: A | Discharge status: Home / Self Care | Payer: Medicare HMO | Source: Ambulatory Visit | Attending: General Surgery | Admitting: General Surgery

## 2022-11-20 ENCOUNTER — Ambulatory Visit (HOSPITAL_BASED_OUTPATIENT_CLINIC_OR_DEPARTMENT_OTHER): Payer: Self-pay | Admitting: Physician Assistant

## 2022-11-20 ENCOUNTER — Ambulatory Visit (HOSPITAL_COMMUNITY): Payer: Self-pay | Admitting: Physician Assistant

## 2022-11-20 ENCOUNTER — Encounter (HOSPITAL_COMMUNITY)
Admission: RE | Disposition: A | Discharge status: Home / Self Care | Payer: Self-pay | Source: Ambulatory Visit | Attending: General Surgery

## 2022-11-20 DIAGNOSIS — Z7982 Long term (current) use of aspirin: Secondary | ICD-10-CM | POA: Diagnosis not present

## 2022-11-20 DIAGNOSIS — C50411 Malignant neoplasm of upper-outer quadrant of right female breast: Secondary | ICD-10-CM | POA: Diagnosis not present

## 2022-11-20 DIAGNOSIS — R2689 Other abnormalities of gait and mobility: Secondary | ICD-10-CM | POA: Insufficient documentation

## 2022-11-20 DIAGNOSIS — C50911 Malignant neoplasm of unspecified site of right female breast: Secondary | ICD-10-CM

## 2022-11-20 DIAGNOSIS — Z79899 Other long term (current) drug therapy: Secondary | ICD-10-CM | POA: Diagnosis not present

## 2022-11-20 DIAGNOSIS — I10 Essential (primary) hypertension: Secondary | ICD-10-CM | POA: Diagnosis not present

## 2022-11-20 DIAGNOSIS — E119 Type 2 diabetes mellitus without complications: Secondary | ICD-10-CM | POA: Insufficient documentation

## 2022-11-20 DIAGNOSIS — Z7984 Long term (current) use of oral hypoglycemic drugs: Secondary | ICD-10-CM | POA: Diagnosis not present

## 2022-11-20 DIAGNOSIS — Z170 Estrogen receptor positive status [ER+]: Secondary | ICD-10-CM | POA: Diagnosis not present

## 2022-11-20 DIAGNOSIS — Z9889 Other specified postprocedural states: Secondary | ICD-10-CM

## 2022-11-20 DIAGNOSIS — Z01818 Encounter for other preprocedural examination: Principal | ICD-10-CM

## 2022-11-20 DIAGNOSIS — E7800 Pure hypercholesterolemia, unspecified: Secondary | ICD-10-CM | POA: Diagnosis not present

## 2022-11-20 HISTORY — PX: BREAST LUMPECTOMY: SHX2

## 2022-11-20 LAB — GLUCOSE, CAPILLARY
Glucose-Capillary: 107 mg/dL — ABNORMAL HIGH (ref 70–99)
Glucose-Capillary: 89 mg/dL (ref 70–99)
Glucose-Capillary: 119 mg/dL — ABNORMAL HIGH (ref 70–99)
Glucose-Capillary: 148 mg/dL — ABNORMAL HIGH (ref 70–99)
Glucose-Capillary: 190 mg/dL — ABNORMAL HIGH (ref 70–99)

## 2022-11-20 SURGERY — BREAST LUMPECTOMY
Anesthesia: General | Site: Breast | Laterality: Right

## 2022-11-20 MED ORDER — FENTANYL CITRATE (PF) 100 MCG/2ML IJ SOLN
INTRAMUSCULAR | Status: AC
  Filled 2022-11-20: qty 2

## 2022-11-20 MED ORDER — METFORMIN HCL ER 500 MG PO TB24
1000.0000 mg | ORAL_TABLET | Freq: Two times a day (BID) | ORAL | Status: DC
  Administered 2022-11-21: 1000 mg via ORAL
  Filled 2022-11-20: qty 2

## 2022-11-20 MED ORDER — ENSURE PRE-SURGERY PO LIQD: 296.0000 mL | Freq: Once | ORAL | Status: DC

## 2022-11-20 MED ORDER — ROCURONIUM BROMIDE 10 MG/ML (PF) SYRINGE
PREFILLED_SYRINGE | INTRAVENOUS | Status: AC
  Filled 2022-11-20: qty 10

## 2022-11-20 MED ORDER — PHENYLEPHRINE 80 MCG/ML (10ML) SYRINGE FOR IV PUSH (FOR BLOOD PRESSURE SUPPORT)
PREFILLED_SYRINGE | INTRAVENOUS | Status: DC | PRN
  Administered 2022-11-20 (×4): 80 ug via INTRAVENOUS
  Administered 2022-11-20: 160 ug via INTRAVENOUS
  Administered 2022-11-20: 80 ug via INTRAVENOUS

## 2022-11-20 MED ORDER — CEFAZOLIN SODIUM-DEXTROSE 2-4 GM/100ML-% IV SOLN
2.0000 g | INTRAVENOUS | Status: AC
  Administered 2022-11-20: 2 g via INTRAVENOUS
  Filled 2022-11-20: qty 100

## 2022-11-20 MED ORDER — ACETAMINOPHEN 500 MG PO TABS
500.0000 mg | ORAL_TABLET | Freq: Four times a day (QID) | ORAL | Status: DC
  Administered 2022-11-21: 500 mg via ORAL
  Filled 2022-11-20 (×2): qty 1

## 2022-11-20 MED ORDER — CHLORHEXIDINE GLUCONATE CLOTH 2 % EX PADS: 6.0000 | MEDICATED_PAD | Freq: Once | CUTANEOUS | Status: DC

## 2022-11-20 MED ORDER — DEXAMETHASONE SODIUM PHOSPHATE 10 MG/ML IJ SOLN
INTRAMUSCULAR | Status: DC | PRN
  Administered 2022-11-20: 5 mg via INTRAVENOUS

## 2022-11-20 MED ORDER — BUPIVACAINE-EPINEPHRINE (PF) 0.25% -1:200000 IJ SOLN
INTRAMUSCULAR | Status: AC
  Filled 2022-11-20: qty 30

## 2022-11-20 MED ORDER — PROPOFOL 10 MG/ML IV BOLUS
INTRAVENOUS | Status: AC
  Filled 2022-11-20: qty 20

## 2022-11-20 MED ORDER — BUPIVACAINE-EPINEPHRINE 0.25% -1:200000 IJ SOLN
INTRAMUSCULAR | Status: DC | PRN
  Administered 2022-11-20: 10 mL

## 2022-11-20 MED ORDER — INSULIN ASPART 100 UNIT/ML IJ SOLN: 0.0000 [IU] | INTRAMUSCULAR | Status: DC | PRN

## 2022-11-20 MED ORDER — INSULIN ASPART 100 UNIT/ML IJ SOLN: 0.0000 [IU] | Freq: Three times a day (TID) | INTRAMUSCULAR | Status: DC

## 2022-11-20 MED ORDER — MIDAZOLAM HCL 2 MG/2ML IJ SOLN
INTRAMUSCULAR | Status: AC
  Filled 2022-11-20: qty 2

## 2022-11-20 MED ORDER — ACETAMINOPHEN 500 MG PO TABS
1000.0000 mg | ORAL_TABLET | ORAL | Status: AC
  Administered 2022-11-20: 1000 mg via ORAL
  Filled 2022-11-20: qty 2

## 2022-11-20 MED ORDER — TRAMADOL HCL 50 MG PO TABS
50.0000 mg | ORAL_TABLET | Freq: Four times a day (QID) | ORAL | Status: DC | PRN
  Administered 2022-11-20: 50 mg via ORAL
  Filled 2022-11-20: qty 1

## 2022-11-20 MED ORDER — LIDOCAINE 2% (20 MG/ML) 5 ML SYRINGE
INTRAMUSCULAR | Status: DC | PRN
  Administered 2022-11-20: 20 mg via INTRAVENOUS

## 2022-11-20 MED ORDER — LIDOCAINE 2% (20 MG/ML) 5 ML SYRINGE
INTRAMUSCULAR | Status: AC
  Filled 2022-11-20: qty 5

## 2022-11-20 MED ORDER — ONDANSETRON HCL 4 MG/2ML IJ SOLN
INTRAMUSCULAR | Status: AC
  Filled 2022-11-20: qty 2

## 2022-11-20 MED ORDER — FENTANYL CITRATE (PF) 100 MCG/2ML IJ SOLN
25.0000 ug | INTRAMUSCULAR | Status: DC | PRN
  Administered 2022-11-20 (×2): 50 ug via INTRAVENOUS

## 2022-11-20 MED ORDER — ORAL CARE MOUTH RINSE: 15.0000 mL | Freq: Once | OROMUCOSAL | Status: AC

## 2022-11-20 MED ORDER — LACTATED RINGERS IV SOLN: INTRAVENOUS | Status: DC

## 2022-11-20 MED ORDER — LOSARTAN POTASSIUM 50 MG PO TABS: 50.0000 mg | ORAL_TABLET | Freq: Every evening | ORAL | Status: DC

## 2022-11-20 MED ORDER — LABETALOL HCL 5 MG/ML IV SOLN
INTRAVENOUS | Status: AC
  Filled 2022-11-20: qty 4

## 2022-11-20 MED ORDER — ONDANSETRON HCL 4 MG/2ML IJ SOLN
INTRAMUSCULAR | Status: DC | PRN
  Administered 2022-11-20: 4 mg via INTRAVENOUS

## 2022-11-20 MED ORDER — SIMETHICONE 80 MG PO CHEW: 40.0000 mg | CHEWABLE_TABLET | Freq: Four times a day (QID) | ORAL | Status: DC | PRN

## 2022-11-20 MED ORDER — PROPOFOL 10 MG/ML IV BOLUS
INTRAVENOUS | Status: DC | PRN
  Administered 2022-11-20: 70 mg via INTRAVENOUS

## 2022-11-20 MED ORDER — ROPINIROLE HCL 0.5 MG PO TABS
0.5000 mg | ORAL_TABLET | Freq: Every day | ORAL | Status: DC
  Filled 2022-11-20: qty 1

## 2022-11-20 MED ORDER — FENTANYL CITRATE (PF) 250 MCG/5ML IJ SOLN
INTRAMUSCULAR | Status: DC | PRN
  Administered 2022-11-20: 50 ug via INTRAVENOUS
  Administered 2022-11-20: 25 ug via INTRAVENOUS

## 2022-11-20 MED ORDER — PHENYLEPHRINE HCL-NACL 20-0.9 MG/250ML-% IV SOLN
INTRAVENOUS | Status: AC
  Filled 2022-11-20: qty 250

## 2022-11-20 MED ORDER — ONDANSETRON HCL 4 MG/2ML IJ SOLN: 4.0000 mg | Freq: Four times a day (QID) | INTRAMUSCULAR | Status: DC | PRN

## 2022-11-20 MED ORDER — SODIUM CHLORIDE 0.9 % IV SOLN: INTRAVENOUS | Status: DC

## 2022-11-20 MED ORDER — TRAMADOL HCL 50 MG PO TABS
ORAL_TABLET | ORAL | Status: AC
  Filled 2022-11-20: qty 1

## 2022-11-20 MED ORDER — FENTANYL CITRATE (PF) 250 MCG/5ML IJ SOLN
INTRAMUSCULAR | Status: AC
  Filled 2022-11-20: qty 5

## 2022-11-20 MED ORDER — DEXAMETHASONE SODIUM PHOSPHATE 10 MG/ML IJ SOLN
INTRAMUSCULAR | Status: AC
  Filled 2022-11-20: qty 1

## 2022-11-20 MED ORDER — CHLORHEXIDINE GLUCONATE 0.12 % MT SOLN
15.0000 mL | Freq: Once | OROMUCOSAL | Status: AC
  Administered 2022-11-20: 15 mL via OROMUCOSAL
  Filled 2022-11-20: qty 15

## 2022-11-20 MED ORDER — PHENYLEPHRINE 80 MCG/ML (10ML) SYRINGE FOR IV PUSH (FOR BLOOD PRESSURE SUPPORT)
PREFILLED_SYRINGE | INTRAVENOUS | Status: AC
  Filled 2022-11-20: qty 20

## 2022-11-20 MED ORDER — ONDANSETRON 4 MG PO TBDP: 4.0000 mg | ORAL_TABLET | Freq: Four times a day (QID) | ORAL | Status: DC | PRN

## 2022-11-20 MED ORDER — 0.9 % SODIUM CHLORIDE (POUR BTL) OPTIME
TOPICAL | Status: DC | PRN
  Administered 2022-11-20: 1000 mL

## 2022-11-20 SURGICAL SUPPLY — 42 items
ADH SKN CLS APL DERMABOND .7 (GAUZE/BANDAGES/DRESSINGS) ×1
APL PRP STRL LF DISP 70% ISPRP (MISCELLANEOUS) ×1
APPLIER CLIP 9.375 MED OPEN (MISCELLANEOUS) ×1
APR CLP MED 9.3 20 MLT OPN (MISCELLANEOUS) ×1
BAG COUNTER SPONGE SURGICOUNT (BAG) ×1 IMPLANT
BAG SPNG CNTER NS LX DISP (BAG)
BINDER BREAST LRG (GAUZE/BANDAGES/DRESSINGS) IMPLANT
BINDER BREAST XLRG (GAUZE/BANDAGES/DRESSINGS) IMPLANT
CANISTER SUCT 3000ML PPV (MISCELLANEOUS) ×1 IMPLANT
CHLORAPREP W/TINT 26 (MISCELLANEOUS) ×1 IMPLANT
CLIP APPLIE 9.375 MED OPEN (MISCELLANEOUS) IMPLANT
CLIP TI MEDIUM 6 (CLIP) ×1 IMPLANT
COVER PROBE W GEL 5X96 (DRAPES) ×1 IMPLANT
COVER SURGICAL LIGHT HANDLE (MISCELLANEOUS) ×1 IMPLANT
DERMABOND ADVANCED .7 DNX12 (GAUZE/BANDAGES/DRESSINGS) ×1 IMPLANT
DEVICE DUBIN SPECIMEN MAMMOGRA (MISCELLANEOUS) ×1 IMPLANT
DRAPE CHEST BREAST 15X10 FENES (DRAPES) ×1 IMPLANT
ELECT COATED BLADE 2.86 ST (ELECTRODE) ×1 IMPLANT
ELECT REM PT RETURN 9FT ADLT (ELECTROSURGICAL) ×1
ELECTRODE REM PT RTRN 9FT ADLT (ELECTROSURGICAL) ×1 IMPLANT
GAUZE PAD ABD 8X10 STRL (GAUZE/BANDAGES/DRESSINGS) IMPLANT
GLOVE BIO SURGEON STRL SZ7 (GLOVE) ×2 IMPLANT
GLOVE BIOGEL PI IND STRL 7.5 (GLOVE) ×1 IMPLANT
GOWN STRL REUS W/ TWL LRG LVL3 (GOWN DISPOSABLE) ×2 IMPLANT
GOWN STRL REUS W/TWL LRG LVL3 (GOWN DISPOSABLE) ×3
KIT BASIN OR (CUSTOM PROCEDURE TRAY) ×1 IMPLANT
KIT MARKER MARGIN INK (KITS) ×1 IMPLANT
NDL HYPO 25GX1X1/2 BEV (NEEDLE) ×1 IMPLANT
NEEDLE HYPO 25GX1X1/2 BEV (NEEDLE) ×1
NS IRRIG 1000ML POUR BTL (IV SOLUTION) ×1 IMPLANT
PACK GENERAL/GYN (CUSTOM PROCEDURE TRAY) ×1 IMPLANT
STRIP CLOSURE SKIN 1/2X4 (GAUZE/BANDAGES/DRESSINGS) ×1 IMPLANT
SUT MNCRL AB 4-0 PS2 18 (SUTURE) ×1 IMPLANT
SUT MON AB 5-0 PS2 18 (SUTURE) IMPLANT
SUT SILK 2 0 SH (SUTURE) IMPLANT
SUT VIC AB 2-0 SH 27 (SUTURE) ×2
SUT VIC AB 2-0 SH 27XBRD (SUTURE) ×1 IMPLANT
SUT VIC AB 3-0 SH 27 (SUTURE) ×1
SUT VIC AB 3-0 SH 27X BRD (SUTURE) ×1 IMPLANT
SYR CONTROL 10ML LL (SYRINGE) ×1 IMPLANT
TOWEL GREEN STERILE (TOWEL DISPOSABLE) ×1 IMPLANT
TOWEL GREEN STERILE FF (TOWEL DISPOSABLE) ×1 IMPLANT

## 2022-11-20 NOTE — Op Note (Signed)
Preoperative diagnosis: Right breast cancer, clinical stage II postoperative diagnosis: Same as above Procedure: Right breast lumpectomy Surgeon: Dr Harden Mo Anesthesia: General Estimated blood loss: Minimal Complications: None Drains: None Specimens: Right breast tissue containing clip marked with paint Additional superior, inferior, medial and posterior margins marked short superior, long lateral and double deep Sponge and count was correct completion Disposition recovery stable condition   Indications: 64 yof with prior benign biopsy who presents after palpable mass found by pcp. She has no dc. 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 She has c density breast tissue. She was noted to have a right breast mass. On [place] this is a 3.4x1.5x2.8 cm mass. Ax [place] is negative.biopsy is a III IDC that is 40% er pos, 10% pr pos, her 2 negative and Ki is 70%. We discussed a lumpectomy for local control.    Procedure: After informed consent was obtained she was taken to the operating room.  She was given antibiotics.  SCDs were placed.  She was placed under general anesthesia without complication.  She was prepped and draped in the standard sterile surgical fashion.  A surgical timeout was then performed.   The mass was palpable superior to the NAC. I then made a periareolar incision in order to hide the scar later.  infiltrated this whole area with Marcaine.  I then dissected the seed.  The mass and some of the surrounding tissue were removed.  Mammogram confirmed removal of the clip. I removed multiple additional margins.  The posterior margin is now the muscle.   I then obtained hemostasis. I did place a couple clips.  I closed the breast tissue with 2-0 Vicryl.  The skin was closed with 3-0 Vicryl and 5-0 Monocryl.  Glue and Steri-Strips were applied.  She tolerated this well was extubated and transferred recovery stable.

## 2022-11-20 NOTE — Anesthesia Procedure Notes (Addendum)
Procedure Name: LMA Insertion Date/Time: 11/20/2022 10:04 AM  Performed by: Thomasene Ripple, CRNAPre-anesthesia Checklist: Patient identified, Emergency Drugs available, Suction available and Patient being monitored Patient Re-evaluated:Patient Re-evaluated prior to induction Oxygen Delivery Method: Circle System Utilized Preoxygenation: Pre-oxygenation with 100% oxygen Induction Type: IV induction Ventilation: Mask ventilation without difficulty LMA: LMA inserted LMA Size: 4.0 Number of attempts: 1 Airway Equipment and Method: Bite block Placement Confirmation: positive ETCO2 Tube secured with: Tape Dental Injury: Teeth and Oropharynx as per pre-operative assessment

## 2022-11-20 NOTE — Anesthesia Preprocedure Evaluation (Addendum)
Anesthesia Evaluation  Patient identified by MRN, date of birth, ID band Patient awake    Reviewed: Allergy & Precautions, NPO status , Patient's Chart, lab work & pertinent test results  Airway Mallampati: III  TM Distance: >3 FB Neck ROM: Full    Dental  (+) Edentulous Upper, Dental Advisory Given   Pulmonary neg pulmonary ROS   Pulmonary exam normal breath sounds clear to auscultation       Cardiovascular hypertension, Pt. on medications Normal cardiovascular exam Rhythm:Regular Rate:Normal     Neuro/Psych negative neurological ROS  negative psych ROS   GI/Hepatic Neg liver ROS,GERD  ,,  Endo/Other  diabetes, Type 2, Oral Hypoglycemic Agents    Renal/GU negative Renal ROS  negative genitourinary   Musculoskeletal  (+) Arthritis ,    Abdominal   Peds  Hematology negative hematology ROS (+)   Anesthesia Other Findings   Reproductive/Obstetrics                             Anesthesia Physical Anesthesia Plan  ASA: 2  Anesthesia Plan: General   Post-op Pain Management: Tylenol PO (pre-op)*   Induction: Intravenous  PONV Risk Score and Plan: 3 and Ondansetron, Dexamethasone and Treatment may vary due to age or medical condition  Airway Management Planned: LMA  Additional Equipment:   Intra-op Plan:   Post-operative Plan: Extubation in OR  Informed Consent: I have reviewed the patients History and Physical, chart, labs and discussed the procedure including the risks, benefits and alternatives for the proposed anesthesia with the patient or authorized representative who has indicated his/her understanding and acceptance.     Dental advisory given  Plan Discussed with: CRNA  Anesthesia Plan Comments:        Anesthesia Quick Evaluation

## 2022-11-20 NOTE — Anesthesia Postprocedure Evaluation (Signed)
Anesthesia Post Note  Patient: Melissa Glenn  Procedure(s) Performed: RIGHT BREAST LUMPECTOMY (Right: Breast)     Patient location during evaluation: PACU Anesthesia Type: General Level of consciousness: awake and alert Pain management: pain level controlled Vital Signs Assessment: post-procedure vital signs reviewed and stable Respiratory status: spontaneous breathing, nonlabored ventilation, respiratory function stable and patient connected to nasal cannula oxygen Cardiovascular status: blood pressure returned to baseline and stable Postop Assessment: no apparent nausea or vomiting Anesthetic complications: no  No notable events documented.  Last Vitals:  Vitals:   11/20/22 1215 11/20/22 1245  BP: 111/61 (!) 105/54  Pulse: 64 (!) 58  Resp: 13 12  Temp:    SpO2: 90% 99%    Last Pain:  Vitals:   11/20/22 1245  TempSrc:   PainSc: Asleep                 [name] L Hodges Treiber

## 2022-11-20 NOTE — Discharge Instructions (Signed)
[facility] Office Phone Number [phone]  POST OP INSTRUCTIONS Take 400 mg of ibuprofen every 8 hours or 650 mg tylenol every 6 hours for next 72 hours then as needed. Use ice several times daily also.  A prescription for pain medication may be given to you upon discharge.  Take your pain medication as prescribed, if needed.  If narcotic pain medicine is not needed, then you may take acetaminophen (Tylenol), naprosyn (Alleve) or ibuprofen (Advil) as needed. Take your usually prescribed medications unless otherwise directed If you need a refill on your pain medication, please contact your pharmacy.  They will contact our office to request authorization.  Prescriptions will not be filled after 5pm or on week-ends. You should eat very light the first 24 hours after surgery, such as soup, crackers, pudding, etc.  Resume your normal diet the day after surgery. Most patients will experience some swelling and bruising in the breast.  Ice packs and a good support bra will help.  Wear the breast binder provided or a sports bra for 72 hours day and night.  After that wear a sports bra during the day until you return to the office. Swelling and bruising can take several days to resolve.  It is common to experience some constipation if taking pain medication after surgery.  Increasing fluid intake and taking a stool softener will usually help or prevent this problem from occurring.  A mild laxative (Milk of Magnesia or Miralax) should be taken according to package directions if there are no bowel movements after 48 hours. I used skin glue on the incision, you may shower in 24 hours.  The glue will flake off over the next 2-3 weeks.  Any sutures or staples will be removed at the office during your follow-up visit. ACTIVITIES:  You may resume regular daily activities (gradually increasing) beginning the next day.  Wearing a good support bra or sports bra minimizes pain and swelling.  You may have  sexual intercourse when it is comfortable. You may drive when you no longer are taking prescription pain medication, you can comfortably wear a seatbelt, and you can safely maneuver your car and apply brakes. RETURN TO WORK:  ______________________________________________________________________________________ Melissa Glenn should see your doctor in the office for a follow-up appointment approximately two weeks after your surgery.  Your doctor's nurse will typically make your follow-up appointment when she calls you with your pathology report.  Expect your pathology report 3-4 business days after your surgery.  You may call to check if you do not hear from [place] after three days. OTHER INSTRUCTIONS: _______________________________________________________________________________________________ _____________________________________________________________________________________________________________________________________ _____________________________________________________________________________________________________________________________________ _____________________________________________________________________________________________________________________________________  WHEN TO CALL DR Melissa Glenn: Fever over 101.0 Nausea and/or vomiting. Extreme swelling or bruising. Continued bleeding from incision. Increased pain, redness, or drainage from the incision.  The clinic staff is available to answer your questions during regular business hours.  Please don't hesitate to call and ask to speak to one of the nurses for clinical concerns.  If you have a medical emergency, go to the nearest emergency room or call 911.  A surgeon from [facility] is always on call at the hospital.  For further questions, please visit [email or web address]

## 2022-11-20 NOTE — Transfer of Care (Signed)
Immediate Anesthesia Transfer of Care Note  Patient: Melissa Glenn  Procedure(s) Performed: RIGHT BREAST LUMPECTOMY (Right: Breast)  Patient Location: PACU  Anesthesia Type:General  Level of Consciousness: drowsy and patient cooperative  Airway & Oxygen Therapy: Patient Spontanous Breathing and Patient connected to face mask oxygen  Post-op Assessment: Report given to RN and Post -op Vital signs reviewed and stable  Post vital signs: Reviewed and stable  Last Vitals:  Vitals Value Taken Time  BP 126/58 11/20/22 1115  Temp    Pulse 62 11/20/22 1119  Resp 0 11/20/22 1119  SpO2 100 % 11/20/22 1119  Vitals shown include unfiled device data.  Last Pain:  Vitals:   11/20/22 1115  TempSrc:   PainSc: Asleep         Complications: No notable events documented.

## 2022-11-20 NOTE — Care Management (Signed)
Transition of Care [facility]) - Inpatient Brief Assessment   Patient Details  Name: Melissa Glenn MRN: [id number] Date of Birth: 03-Dec-1930  Transition of Care [facility]) CM/SW Contact:    Lockie Pares, RN Phone Number: 11/20/2022, 3:29 PM   Clinical Narrative: Patient presented as elective surgery for lumpectomy. 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 Has family nearby. No needs identified at this time. If needs are identified please place a TOC consult.   Transition of Care Asessment: Insurance and Status: Insurance coverage has been reviewed Patient has primary care physician: Yes Home environment has been reviewed: [facility] Prior level of function:: Independent Prior/Current Home Services: No current home services Social Determinants of Health Reivew: SDOH reviewed no interventions necessary Readmission risk has been reviewed: Yes Transition of care needs: no transition of care needs at this time

## 2022-11-20 NOTE — Interval H&P Note (Signed)
History and Physical Interval Note:  11/20/2022 9:53 AM  Melissa Glenn  has presented today for surgery, with the diagnosis of RIGHT BREAST CANCER.  The various methods of treatment have been discussed with the patient and family. After consideration of risks, benefits and other options for treatment, the patient has consented to  Procedure(s): RIGHT BREAST LUMPECTOMY (Right) as a surgical intervention.  The patient's history has been reviewed, patient examined, no change in status, stable for surgery.  I have reviewed the patient's chart and labs.  Questions were answered to the patient's satisfaction.     Emelia Loron

## 2022-11-21 ENCOUNTER — Telehealth: Payer: Self-pay | Admitting: Radiation Oncology

## 2022-11-21 ENCOUNTER — Encounter (HOSPITAL_COMMUNITY): Payer: Self-pay | Admitting: General Surgery

## 2022-11-21 DIAGNOSIS — Z170 Estrogen receptor positive status [ER+]: Secondary | ICD-10-CM | POA: Diagnosis not present

## 2022-11-21 DIAGNOSIS — C50411 Malignant neoplasm of upper-outer quadrant of right female breast: Secondary | ICD-10-CM | POA: Diagnosis not present

## 2022-11-21 DIAGNOSIS — E7800 Pure hypercholesterolemia, unspecified: Secondary | ICD-10-CM | POA: Diagnosis not present

## 2022-11-21 DIAGNOSIS — Z7984 Long term (current) use of oral hypoglycemic drugs: Secondary | ICD-10-CM | POA: Diagnosis not present

## 2022-11-21 DIAGNOSIS — E119 Type 2 diabetes mellitus without complications: Secondary | ICD-10-CM | POA: Diagnosis not present

## 2022-11-21 DIAGNOSIS — Z7982 Long term (current) use of aspirin: Secondary | ICD-10-CM | POA: Diagnosis not present

## 2022-11-21 DIAGNOSIS — Z79899 Other long term (current) drug therapy: Secondary | ICD-10-CM | POA: Diagnosis not present

## 2022-11-21 DIAGNOSIS — I10 Essential (primary) hypertension: Secondary | ICD-10-CM | POA: Diagnosis not present

## 2022-11-21 DIAGNOSIS — R2689 Other abnormalities of gait and mobility: Secondary | ICD-10-CM | POA: Diagnosis not present

## 2022-11-21 LAB — GLUCOSE, CAPILLARY: Glucose-Capillary: 105 mg/dL — ABNORMAL HIGH (ref 70–99)

## 2022-11-21 NOTE — Telephone Encounter (Signed)
9/20 @ 10:59 am spoke to patient to be schedule for consult with ct sim/treatment planning.  Patient in process of being discharge from [facility].  She requested a call back later.

## 2022-11-21 NOTE — Progress Notes (Signed)
1 Day Post-Op   Subjective/Chief Complaint: No issues overnight   Objective: Vital signs in last 24 hours: Temp:  [97.3 F (36.3 C)-97.8 F (36.6 C)] 97.7 F (36.5 C) (09/20 0743) Pulse Rate:  [58-72] 60 (09/20 0743) Resp:  [10-20] 18 (09/20 0356) BP: (102-126)/(48-68) 108/55 (09/20 0743) SpO2:  [90 %-100 %] 95 % (09/20 0743)    Intake/Output from previous day: 09/19 0701 - 09/20 0700 In: 400 [I.V.:400] Out: 5 [Blood:5] Intake/Output this shift: No intake/output data recorded.  Right breast without hematoma  Lab Results:  No results for input(s): "WBC", "HGB", "HCT", "PLT" in the last 72 hours. BMET No results for input(s): "NA", "K", "CL", "CO2", "GLUCOSE", "BUN", "CREATININE", "CALCIUM" in the last 72 hours. PT/INR No results for input(s): "LABPROT", "INR" in the last 72 hours. ABG No results for input(s): "PHART", "HCO3" in the last 72 hours.  Invalid input(s): "PCO2", "PO2"  Studies/Results: No results found.  Anti-infectives: Anti-infectives (From admission, onward)    Start     Dose/Rate Route Frequency Ordered Stop   11/20/22 0715  ceFAZolin (ANCEF) IVPB 2g/100 mL premix        2 g 200 mL/hr over 30 Minutes Intravenous On call to O.R. 11/20/22 0706 11/20/22 1006       Assessment/Plan: POD 1 right lumpectomy -doing well -home meds -Pt eval and then home today    Emelia Loron 11/21/2022

## 2022-11-21 NOTE — Telephone Encounter (Signed)
9/20 @ 2:56 pm Left voicemail on both patient's contact numbers to be schedule for consult with ct sim/treatment planning.

## 2022-11-21 NOTE — TOC Transition Note (Signed)
Transition of Care [facility]) - CM/SW Discharge Note   Patient Details  Name: Melissa Glenn MRN: [id number] Date of Birth: 22-Apr-1930  [facility] CM/SW Contact:  Lockie Pares, RN Phone Number: 11/21/2022, 10:13 AM   Clinical Narrative:    PT evaluation, no needs for  DME or PT follow up.  TOC signing off.   Final next level of care: Home/Self Care Barriers to Discharge: No Barriers Identified   Patient Goals and CMS Choice      Discharge Placement                         Discharge Plan and Services Additional resources added to the After Visit Summary for                                       Social Determinants of Health (SDOH) Interventions SDOH Screenings   Food Insecurity: No Food Insecurity (11/20/2022)  Housing: Low Risk  (11/20/2022)  Transportation Needs: No Transportation Needs (11/20/2022)  Utilities: Not At Risk (11/20/2022)  Tobacco Use: Low Risk  (11/19/2022)     Readmission Risk Interventions     No data to display

## 2022-11-21 NOTE — Evaluation (Addendum)
Physical Therapy Brief Evaluation and Discharge Note Patient Details Name: Melissa Glenn MRN: [id number] DOB: 09/01/1930 Today's Date: 11/21/2022   History of Present Illness  89 yo female admitted 9/19 for Rt breast lumpectomy. PMhx: T2DM, GERD, anxiety, HLD  Clinical Impression  Pt very pleasant and reports living alone in [place], driving, grocery shopping, managing stairs at home and 2 falls in the last year. Pt has been through OPPT for balance and back pain and reports doing well since then and aware she needs to increase her daily activity. Pt with slight balance deficits and encouraged to use her walking stick for stability and assist in the community. No further acute therapy needs with pt aware and agreeable.     SPO2 92% on RA with gait     PT Assessment Patient does not need any further PT services  Assistance Needed at Discharge  None    Equipment Recommendations None recommended by PT  Recommendations for Other Services       Precautions/Restrictions Precautions Precautions: Fall        Mobility  Bed Mobility Rolling: Modified independent (Device/Increase time)        Transfers Overall transfer level: Independent                      Ambulation/Gait Ambulation/Gait assistance: Modified independent (Device/Increase time) Gait Distance (Feet): 600 Feet Assistive device: None Gait Pattern/deviations: WFL(Within Functional Limits) Gait Speed: Pace WFL General Gait Details: slight veering right and left at times, able to dual task and self correct with balance deficits  Home Activity Instructions    Stairs Stairs: Yes Stairs assistance: Modified independent (Device/Increase time) Stair Management: One rail Left, Alternating pattern, Forwards Number of Stairs: 11    Modified Rankin (Stroke Patients Only)        Balance Overall balance assessment: Mild deficits observed, not formally tested   Sitting balance-Leahy Scale:  Normal       Standing balance-Leahy Scale: Good            Pertinent Vitals/Pain   Pain Assessment Pain Assessment: No/denies pain     Home Living Family/patient expects to be discharged to:: Private residence Living Arrangements: Alone   Home Environment: Stairs to enter  [employer] of Steps: 5 Home Equipment: None        Prior Function Level of Independence: Independent      UE/LE Assessment   UE ROM/Strength/Tone/Coordination: WFL    LE ROM/Strength/Tone/Coordination: [facility]      Communication   Communication Communication: No apparent difficulties     Cognition Overall Cognitive Status: Appears within functional limits for tasks assessed/performed       General Comments      Exercises     Assessment/Plan    PT Problem List         PT Visit Diagnosis Other abnormalities of gait and mobility (R26.89)    No Skilled PT All education completed;Patient at baseline level of functioning   Co-evaluation                AMPAC 6 Clicks Help needed turning from your back to your side while in a flat bed without using bedrails?: None Help needed moving from lying on your back to sitting on the side of a flat bed without using bedrails?: None Help needed moving to and from a bed to a chair (including a wheelchair)?: None Help needed standing up from a chair using your arms (e.g., wheelchair or bedside chair)?:  None Help needed to walk in hospital room?: None Help needed climbing 3-5 steps with a railing? : None 6 Click Score: 24      End of Session   Activity Tolerance: Patient tolerated treatment well Patient left: in chair;with call bell/phone within reach Nurse Communication: Mobility status PT Visit Diagnosis: Other abnormalities of gait and mobility (R26.89)     Time: 1610-9604 PT Time Calculation (min) (ACUTE ONLY): 15 min  Charges:   PT Evaluation $PT Eval Low Complexity: 1 Low      Giorgi Debruin P, PT Acute Rehabilitation  Services Office: [phone]   Enedina Finner Keylin Podolsky  11/21/2022, 10:01 AM

## 2022-11-21 NOTE — Plan of Care (Signed)
Problem: Education: Goal: Ability to describe self-care measures that may prevent or decrease complications (Diabetes Survival Skills Education) will improve Outcome: Progressing   Problem: Coping: Goal: Ability to adjust to condition or change in health will improve Outcome: Progressing   Problem: Skin Integrity: Goal: Risk for impaired skin integrity will decrease Outcome: Progressing   Problem: Tissue Perfusion: Goal: Adequacy of tissue perfusion will improve Outcome: Progressing

## 2022-11-24 NOTE — Discharge Summary (Addendum)
Physician Discharge Summary  Patient ID: Melissa Glenn MRN: [id number] DOB/AGE: Jun 08, 1930 89 y.o.  Admit date: 11/20/2022 Discharge date: 11/21/2022  Admission Diagnoses: Diabetes Hypertension Hypercholesterolemia Breast cancer  Discharge Diagnoses:  Principal Problem:   Status post right breast lumpectomy   Discharged Condition: good  Hospital Course: 70 yof underwent uncomplicated right breast lumpectomy.  Remained overnight due to monitoring. Cleared by PT following am and ready for dc  Consults: None  Significant Diagnostic Studies: none  Treatments: surgery: right lumpectomy  Discharge Exam: Blood pressure (!) 108/55, pulse 60, temperature 97.7 F (36.5 C), temperature source Oral, resp. rate 18, height 5\' 3"  (1.6 m), weight 60.8 kg, SpO2 95%.   Disposition: Discharge disposition: 01-Home or Self Care        Allergies as of 11/21/2022       Reactions   Codeine Other (See Comments)   Dizziness   Lorcet [hydrocodone-acetaminophen] Nausea And Vomiting   Sulfa Antibiotics Other (See Comments), Tinitus   DIZZINESS        Medication List     TAKE these medications    acetaminophen 325 MG tablet Commonly known as: TYLENOL Take 650 mg by mouth every 6 (six) hours as needed for moderate pain (pain).   aspirin EC 81 MG tablet Take 81 mg by mouth daily after supper.   CALCIUM CITRATE + D3 PO Take 1 tablet by mouth every evening.   cholecalciferol 1000 units tablet Commonly known as: VITAMIN D Take 1,000 Units by mouth in the morning, at noon, and at bedtime.   glucose blood test strip 1 each by Other route as needed. Use as instructed   hydrocortisone cream 1 % Apply 1 Application topically 2 (two) times daily as needed (skin irritation/itching).   losartan 50 MG tablet Commonly known as: COZAAR Take 50 mg by mouth every evening.   metFORMIN 500 MG 24 hr tablet Commonly known as: GLUCOPHAGE-XR Take 1,000 mg by mouth 2 (two) times  daily.   rOPINIRole 0.5 MG tablet Commonly known as: REQUIP Take 0.5 mg by mouth at bedtime.        Follow-up Information     Emelia Loron, MD Follow up in 3 week(s).   Specialty: General Surgery Contact information: [address] [place] [address] [phone]                 Signed: Emelia Loron 11/24/2022, 9:13 AM

## 2022-11-25 ENCOUNTER — Encounter: Payer: Self-pay | Admitting: *Deleted

## 2022-11-25 ENCOUNTER — Encounter: Payer: Self-pay | Admitting: Genetic Counselor

## 2022-11-25 ENCOUNTER — Telehealth: Payer: Self-pay | Admitting: Genetic Counselor

## 2022-11-25 ENCOUNTER — Ambulatory Visit: Payer: Self-pay | Admitting: Genetic Counselor

## 2022-11-25 DIAGNOSIS — Z1379 Encounter for other screening for genetic and chromosomal anomalies: Secondary | ICD-10-CM | POA: Insufficient documentation

## 2022-11-25 DIAGNOSIS — Z170 Estrogen receptor positive status [ER+]: Secondary | ICD-10-CM

## 2022-11-25 NOTE — Progress Notes (Signed)
Location of Breast Cancer: Malignant neoplasm of upper-outer quadrant of right breast in female, estrogen receptor positive   Histology per Pathology Report:  11-20-22 FINAL MICROSCOPIC DIAGNOSIS:  A. BREAST, RIGHT, LUMPECTOMY: Invasive ductal carcinoma, 1.8 x 1.5 x 1.0 cm, grade III/III Ductal carcinoma in situ: Not identified Margins, invasive: Negative     Closest, invasive: 7 mm in conjunction with new medial margin (see part D) Margins, DCIS: N/A     Closest, DCIS: N/A Lymphovascular invasion: Not identified Prognostic markers:  ER positive, PR positive, Her2 negative, Ki-67 70% Other: None See oncology table, parts B, C, D  E and gross description below for further information  B. BREAST, RIGHT POSTERIOR MARGIN, EXCISION: -  Benign adipose tissue and skeletal muscle, negative for malignancy, new additional posterior margin depth 8 mm.  C. BREAST, RIGHT SUPERIOR MARGIN, EXCISION: -  Benign breast tissue, negative for malignancy, new additional superior margin depth 5 mm  D. BREAST, RIGHT MEDIAL MARGIN, EXCISION: -  Benign breast tissue, negative for malignancy, for a medial margin of 7 mm.  E. BREAST, RIGHT INFERIOR MARGIN, EXCISION: -  Benign breast tissue with an incidental radial scar with usual ductal hyperplasia, completely excised and new additional inferior margin depth of 11 mm.  ONCOLOGY TABLE:  INVASIVE CARCINOMA OF THE BREAST:  Resection  Procedure: Lumpectomy Specimen Laterality: Right Histologic Type: Invasive ductal carcinoma (NOS)/invasive mammary carcinoma, NST Histologic Grade:      Glandular (Acinar)/Tubular Differentiation: 2/3      Nuclear Pleomorphism: 3/3      Mitotic Rate: 3/3      Overall Grade: III/III Tumor Size: 1.8 x 1.5 x 1.0 cm Ductal Carcinoma In Situ: Not identified Lymphatic and/or Vascular Invasion: Not identified Treatment Effect in the Breast: No known presurgical therapy Margins: In conjunction with parts B, C, D and E  all margins negative (medial margin positive on part A)      Distance from Closest Margin (mm): Medial      Specify Closest Margin (required only if <20mm): 7 mm (new medial margin thickness part B) DCIS Margins: N/A      Distance from Closest Margin (mm): N/A      Specify Closest Margin (required only if <20mm): N/A Regional Lymph Nodes: N/A; no lymph nodes submitted or found      Number of Lymph Nodes Examined: 0      Number of Sentinel Nodes Examined: 0      Number of Lymph Nodes with Macrometastases (>2 mm): N/A      Number of Lymph Nodes with Micrometastases: N/A      Number of Lymph Nodes with Isolated Tumor Cells (=0.2 mm or =200 cells): N/A      Size of Largest Metastatic Deposit (mm): N/A      Extranodal Extension: N/A Distant Metastasis:      Distant Site(s) Involved: N/A Breast Biomarker Testing Performed on Previous Biopsy:      Testing Performed on Case Number: [id number]            Estrogen Receptor: Positive, 40% weak            Progesterone Receptor: Positive, 10% weak to moderate            HER2: Negative (1+)            Ki-67: 70% Pathologic Stage Classification (pTNM, AJCC 8th Edition): pT1c, pNX Representative Tumor Block: A1 Comment(s): None (v4.5.0.0)  Receptor Status: ER(40%), PR (10%), Her2-neu (neg), Ki-67(70%)  Did patient present with  symptoms (if so, please note symptoms) or was this found on screening mammography?: palpable mass  Past/Anticipated interventions by surgeon, if any: 11-20-22 Procedure: Right breast lumpectomy Surgeon: Dr Harden Mo  Past/Anticipated interventions by medical oncology, if any:  Rachel Moulds, MD 11/12/2022   HISTORY OF PRESENTING ILLNESS:  Melissa Glenn 89 y.o. female is here because of newly diagnosed breast cancer   This is a very pleasant 89 year old female patient with past medical history significant for type 2 diabetes mellitus, hypertension referred to breast [facility] for new diagnosis of right-sided  breast cancer.  Patient arrived to the appointment by herself.  She lives independently, has 1 child in town and has 3 other children living close by. She tells me that she has not noticed this lump.  She went to her PCP for another cyst in the breast and they have found his large breast mass and this prompted further investigation.   8/28 she had diagnostic mammogram and this showed highly suspicious mass at the palpable site of concern in the right breast at 12:00 measuring 3.4 cm.  Ultrasound confirmed this findings.   Pathology from the right breast showed invasive poorly differentiated ductal adenocarcinoma, grade 3, prognostic showed ER 40% positive weak staining, PR 10% positive weak to moderate staining, Ki-67 of 70% and HER2 1+   Bedside the breast lump, she has some back pain in the upper back and has been working with chiropractor, also reports some restless leg syndrome or neuropathy, takes ropinirole.  Rest of the pertinent 10 point ROS reviewed and negative.  She had 6 children, breast-fed all of them, lost 2 children.  She denies any hormone replacement or contraception.  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  Rest of the pertinent 10 point ROS reviewed and negative  ASSESSMENT & PLAN:  Malignant neoplasm of upper-outer quadrant of right breast in female, estrogen receptor positive (HCC) This is a very pleasant 89 year old female patient with newly diagnosed right breast grade 3 IDC, ER 40% positive weak staining, PR 10% positive weak to moderate staining, Ki-67 of 70% and HER2 1+ referred to breast M[facility] for recommendations.  Patient is a robust 89 and has been physically active, she is a retired Engineer, civil (consulting) as well.  No concerns on review of systems.  Physical examination with right breast upper outer quadrant mass measuring approximately 3-1/2 cm with no palpable regional adenopathy.  Left breast has chronically inverted nipple since her last biopsy.  Given near triple negative  tumor, we have discussed about considering upfront surgery followed by adjuvant radiation.  Although chemotherapy would have been standard of care for near triple negative/functional triple negative tumors, given her age, we have discussed that chemotherapy may not be tolerated very well and she is in complete agreement with this.  If repeat prognostics still show ER/PR positive staining, we will consider antiestrogen therapy.  She understands the role of antiestrogen therapy.  All her questions were answered to the best of my knowledge.  Thank you for consulting [place] in the care of this patient.  Please do not hesitate to contact us with any additional questions or concerns.  Lymphedema issues, if any:  no  Pain issues, if any:  mild pain in breat area with touch,   SAFETY ISSUES: Prior radiation? no Pacemaker/ICD? no Possible current pregnancy?no Is the patient on methotrexate? no  Current Complaints / other details:  pt is more short of breath lately, This is new for her. Pt  is having mild drainage at breast surgical site.   Vitals:   12/09/22 0819  BP: 117/60  Pulse: 66  Resp: 20  Temp: (!) 97.4 F (36.3 C)  SpO2: 94%

## 2022-11-25 NOTE — Telephone Encounter (Signed)
Disclosed negative genetics.

## 2022-11-25 NOTE — Progress Notes (Signed)
HPI:   [name] was previously seen in the [facility] due to a personal history of breast cancer and concerns regarding a hereditary predisposition to cancer.    [name] recent genetic test results were disclosed to her by telephone. These results and recommendations are discussed in more detail below.  CANCER HISTORY:  In September 2024, at the age of 89, [name] was diagnosed with invasive ductal carcinoma of the right breast (ER 40% positive weak staining, PR 10% positive weak to moderate staining, Ki-67 of 70% and HER2 negative).   Oncology History  Malignant neoplasm of upper-outer quadrant of right breast in female, estrogen receptor positive (HCC)  11/10/2022 Initial Diagnosis   Malignant neoplasm of upper-outer quadrant of right breast in female, estrogen receptor positive (HCC)   11/20/2022 Genetic Testing   Negative [facility] +RNAinsight Panel.  Report date is 11/20/2022.   The [facility] CancerNext+RNAinsight Panel includes sequencing, rearrangement analysis, and RNA analysis for the following 34 genes: APC, ATM, BARD1, BMPR1A, BRCA1, BRCA2, BRIP1, CDH1, CDK4, CDKN2A, CHEK2, DICER1, MLH1, MSH2, MSH6, MUTYH, NF1, NTHL1, PALB2, PMS2, PTEN, RAD51C, RAD51D, SMAD4, SMARCA4, STK11 and TP53 (sequencing and deletion/duplication); AXIN2, HOXB13, MSH3, POLD1 and POLE (sequencing only); EPCAM and GREM1 (deletion/duplication only).     FAMILY HISTORY:  We obtained a detailed family history.  She did not report any known family history of cancer.  However, she does not have any information about paternal relatives.       [name] is unaware of previous family history of genetic testing for hereditary cancer risks.    There is no reported Ashkenazi Jewish ancestry. There is no known consanguinity.  GENETIC TEST RESULTS:  The [facility]+RNAinsight Panel found no pathogenic mutations.   The [facility]+RNAinsight Panel includes sequencing,  rearrangement analysis, and RNA analysis for the following 34 genes: APC, ATM, BARD1, BMPR1A, BRCA1, BRCA2, BRIP1, CDH1, CDK4, CDKN2A, CHEK2, DICER1, MLH1, MSH2, MSH6, MUTYH, NF1, NTHL1, PALB2, PMS2, PTEN, RAD51C, RAD51D, SMAD4, SMARCA4, STK11 and TP53 (sequencing and deletion/duplication); AXIN2, HOXB13, MSH3, POLD1 and POLE (sequencing only); EPCAM and GREM1 (deletion/duplication only). .   The test report has been scanned into EPIC and is located under the Molecular Pathology section of the Results Review tab.  A portion of the result report is included below for reference. Genetic testing reported out on November 20, 2022.      Even though a pathogenic variant was not identified, possible explanations for the cancer in the family may include: There may be no hereditary risk for cancer in the family. The cancers in [name] and/or her family may be sporadic/familial or due to other genetic and environmental factors.  This is most likely as most cancer is not hereditary.  There may be a gene mutation in one of these genes that current testing methods cannot detect but that chance is small. There could be another gene that has not yet been discovered, or that we have not yet tested, that is responsible for the cancer diagnoses in the family.   Therefore, it is important to remain in touch with cancer genetics in the future so that we can continue to offer [name] the most up to date genetic testing.   ADDITIONAL GENETIC TESTING:   [name] genetic testing was fairly extensive.  If there are additional relevant genes identified to increase cancer risk that can be analyzed in the future, we would be happy to discuss and coordinate this testing at that time.  CANCER SCREENING RECOMMENDATIONS:  [name] test result is considered negative (normal).  This means that we have not identified a hereditary cause for her personal history of breast cancer at this time.   An  individual's cancer risk and medical management are not determined by genetic test results alone. Overall cancer risk assessment incorporates additional factors, including personal medical history, family history, and any available genetic information that may result in a personalized plan for cancer prevention and surveillance. Therefore, it is recommended she continue to follow the cancer management and screening guidelines provided by her oncology and primary healthcare provider.    RECOMMENDATIONS FOR FAMILY MEMBERS:   Since she did not inherit a identifiable mutation in a cancer predisposition gene included on this panel, her children could not have inherited a known mutation from her in one of these genes. Individuals in this family might be at some increased risk of developing cancer, over the general population risk, due to the family history of cancer.  Individuals in the family should notify their providers of the family history of cancer. We recommend women in this family have a yearly mammogram beginning at age 65, or 67 years younger than the earliest onset of cancer, an annual clinical breast exam, and perform monthly breast self-exams.  Risk models that take into account family history and hormonal history may be helpful in determining appropriate breast cancer screening options for family members.   FOLLOW-UP:  Cancer genetics is a rapidly advancing field and it is possible that new genetic tests will be appropriate for her and/or her family members in the future. We encourage [name] to remain in contact with cancer genetics, so we can update her personal and family histories and let her know of advances in cancer genetics that may benefit this family.   Our contact number was provided.  They are welcome to call us at anytime with additional questions or concerns.   [place] Doig M. Rennie Plowman, MS, [facility] Genetic Counselor [email or web address] ([email or web address]) [phone]

## 2022-11-26 LAB — SURGICAL PATHOLOGY

## 2022-11-27 ENCOUNTER — Inpatient Hospital Stay: Payer: Medicare HMO

## 2022-11-27 NOTE — Progress Notes (Signed)
[facility] CSW Progress Note  Clinical Child psychotherapist contacted patient following [facility] appointments per new patient protocol. CSW assessed patients adjustment to diagnosis and recovery from recent surgery. CSW reviewed SDOH answers with patient. Patient denies any concerns at this time. Patient was given direct information should needs change.    Marguerita Merles, LCSWA Clinical Social Worker [facility]

## 2022-12-01 ENCOUNTER — Encounter: Payer: Self-pay | Admitting: *Deleted

## 2022-12-08 DIAGNOSIS — Z170 Estrogen receptor positive status [ER+]: Secondary | ICD-10-CM | POA: Diagnosis not present

## 2022-12-08 DIAGNOSIS — C50411 Malignant neoplasm of upper-outer quadrant of right female breast: Secondary | ICD-10-CM | POA: Diagnosis not present

## 2022-12-08 NOTE — Progress Notes (Signed)
Radiation Oncology         [phone] ________________________________  Name: Melissa Glenn MRN: [id number]  Date: 12/09/2022  DOB: 12-31-1930  Follow-Up Visit Note  Outpatient  CC: Merri Brunette, MD  Rachel Moulds, MD  Diagnosis:   No diagnosis found.   Stage IIA (cT2, cN0, cM0) Right Breast UOQ, Invasive Ductal Carcinoma, ER+ / PR+ / Her2-, Grade 3: s/p lumpectomy without SLN evaluation   CHIEF COMPLAINT: Here to discuss management of right breast cancer  Narrative:  The patient returns today for follow-up.     On her breast clinic consultation date of 11/12/22, she underwent genetic testing. Results showed no clinically significant variants detected by BRCAplus or +RNAinsight testing.  Since her consultation date, she opted to proceed with a right breast lumpectomy without nodal biopsies on 11/20/22 under the care of Dr. Dwain Sarna.  Pathology from the procedure revealed: tumor size of 1.8 cm; histology of grade 3 invasive ductal carcinoma; all margins negative for invasive carcinoma; margin status to invasive disease of 7 mm from the final medical margin; ER status: 15% positive and PR status: 10% positive, both with weak staining intensity; Proliferation marker Ki67 at 60%; Her2 status negative; Grade 3.  She was seen in consultation by Dr. Al Pimple on her breast clinic consultation date. Although her prognostics from her biopsy specimen were nearly triple negative, Dr. Al Pimple does not recommend chemotherapy in light of her age. The patient is in agreement with this and will likely proceed with antiestrogen therapy since repeat prognostics on her final pathology again show a positive ER status (weakly positive).   Symptomatically, the patient reports: ***        ALLERGIES:  is allergic to codeine, lorcet [hydrocodone-acetaminophen], and sulfa antibiotics.  Meds: Current Outpatient Medications  Medication Sig Dispense Refill   acetaminophen (TYLENOL) 325 MG tablet Take 650  mg by mouth every 6 (six) hours as needed for moderate pain (pain).     aspirin EC 81 MG tablet Take 81 mg by mouth daily after supper.     Calcium Citrate-Vitamin D (CALCIUM CITRATE + D3 PO) Take 1 tablet by mouth every evening.     cholecalciferol (VITAMIN D) 1000 UNITS tablet Take 1,000 Units by mouth in the morning, at noon, and at bedtime.     glucose blood test strip 1 each by Other route as needed. Use as instructed     hydrocortisone cream 1 % Apply 1 Application topically 2 (two) times daily as needed (skin irritation/itching).     losartan (COZAAR) 50 MG tablet Take 50 mg by mouth every evening.     metFORMIN (GLUCOPHAGE-XR) 500 MG 24 hr tablet Take 1,000 mg by mouth 2 (two) times daily.     rOPINIRole (REQUIP) 0.5 MG tablet Take 0.5 mg by mouth at bedtime.     No current facility-administered medications for this encounter.    Physical Findings:  vitals were not taken for this visit. .     General: Alert and oriented, in no acute distress HEENT: Head is normocephalic. Extraocular movements are intact. Oropharynx is clear. Neck: Neck is supple, no palpable cervical or supraclavicular lymphadenopathy. Heart: Regular in rate and rhythm with no murmurs, rubs, or gallops. Chest: Clear to auscultation bilaterally, with no rhonchi, wheezes, or rales. Abdomen: Soft, nontender, nondistended, with no rigidity or guarding. Extremities: No cyanosis or edema. Lymphatics: see Neck Exam Musculoskeletal: symmetric strength and muscle tone throughout. Neurologic: No obvious focalities. Speech is fluent.  Psychiatric: Judgment and insight are  intact. Affect is appropriate. Breast exam reveals ***  Lab Findings: Lab Results  Component Value Date   WBC 5.6 11/12/2022   HGB 12.0 11/12/2022   HCT 36.6 11/12/2022   MCV 86.9 11/12/2022   PLT 220 11/12/2022    @LASTCHEMISTRY @  Radiographic Findings: No results found.  Impression/Plan: We discussed adjuvant radiotherapy today.  I  recommend *** in order to ***.  I reviewed the logistics, benefits, risks, and potential side effects of this treatment in detail. Risks may include but not necessary be limited to acute and late injury tissue in the radiation fields such as skin irritation (change in color/pigmentation, itching, dryness, pain, peeling). She may experience fatigue. We also discussed possible risk of long term cosmetic changes or scar tissue. There is also a smaller risk for lung toxicity, ***cardiac toxicity, ***brachial plexopathy, ***lymphedema, ***musculoskeletal changes, ***rib fragility or ***induction of a second malignancy, ***late chronic non-healing soft tissue wound.    The patient asked good questions which I answered to her satisfaction. She is enthusiastic about proceeding with treatment. A consent form has been *** signed and placed in her chart.  A total of *** medically necessary complex treatment devices will be fabricated and supervised by me: *** fields with MLCs for custom blocks to protect heart, and lungs;  and, a Vac-lok. MORE COMPLEX DEVICES MAY BE MADE IN DOSIMETRY FOR FIELD IN FIELD BEAMS FOR DOSE HOMOGENEITY.  I have requested : 3D Simulation which is medically necessary to give adequate dose to at risk tissues while sparing lungs and heart.  I have requested a DVH of the following structures: lungs, heart, *** lumpectomy cavity.    The patient will receive *** Gy in *** fractions to the *** with *** fields.  This will be *** followed by a boost.  On date of service, in total, I spent *** minutes on this encounter. Patient was seen in person.  _____________________________________   Lonie Peak, MD  This document serves as a record of services personally performed by Lonie Peak, MD. It was created on her behalf by Neena Rhymes, a trained medical scribe. The creation of this record is based on the scribe's personal observations and the provider's statements to them. This document has been  checked and approved by the attending provider.

## 2022-12-09 ENCOUNTER — Encounter: Payer: Self-pay | Admitting: Radiation Oncology

## 2022-12-09 ENCOUNTER — Ambulatory Visit
Admission: RE | Admit: 2022-12-09 | Discharge: 2022-12-09 | Disposition: A | Discharge status: Home / Self Care | Payer: Medicare HMO | Source: Ambulatory Visit | Attending: Radiation Oncology | Admitting: Radiation Oncology

## 2022-12-09 VITALS — BP 117/60 | HR 66 | Temp 97.4°F | Resp 20 | Ht 63.0 in | Wt 133.8 lb

## 2022-12-09 DIAGNOSIS — Z510 Encounter for antineoplastic radiation therapy: Secondary | ICD-10-CM | POA: Insufficient documentation

## 2022-12-09 DIAGNOSIS — Z170 Estrogen receptor positive status [ER+]: Secondary | ICD-10-CM | POA: Diagnosis not present

## 2022-12-09 DIAGNOSIS — Z7984 Long term (current) use of oral hypoglycemic drugs: Secondary | ICD-10-CM | POA: Insufficient documentation

## 2022-12-09 DIAGNOSIS — Z79899 Other long term (current) drug therapy: Secondary | ICD-10-CM | POA: Insufficient documentation

## 2022-12-09 DIAGNOSIS — C50411 Malignant neoplasm of upper-outer quadrant of right female breast: Secondary | ICD-10-CM | POA: Diagnosis present

## 2022-12-09 DIAGNOSIS — Z7982 Long term (current) use of aspirin: Secondary | ICD-10-CM | POA: Insufficient documentation

## 2022-12-10 DIAGNOSIS — C50411 Malignant neoplasm of upper-outer quadrant of right female breast: Secondary | ICD-10-CM | POA: Diagnosis not present

## 2022-12-10 DIAGNOSIS — Z170 Estrogen receptor positive status [ER+]: Secondary | ICD-10-CM | POA: Diagnosis not present

## 2022-12-10 DIAGNOSIS — Z510 Encounter for antineoplastic radiation therapy: Secondary | ICD-10-CM | POA: Diagnosis not present

## 2022-12-11 ENCOUNTER — Telehealth: Payer: Self-pay

## 2022-12-11 NOTE — Telephone Encounter (Signed)
RN called pt to check on her concerns after ct sim. Pt had noted to Dr. Dwain Sarna that that she had floaters and also back pain with ct sim. Rn encouraged pt that regular treatments would be much shorter that ct sim and her back pain should improve with a shorter time frame. Pt did report she still gets floaters when she lays down and will reach out to her eye doctor for an appointment. Rn encouraged pt that her treatments will still be on the same schedule even with this concern. Pt understood and no had no immediate concerns at this time. Pt will reach out with any needs.

## 2022-12-16 ENCOUNTER — Encounter: Payer: Self-pay | Admitting: *Deleted

## 2022-12-16 DIAGNOSIS — Z170 Estrogen receptor positive status [ER+]: Secondary | ICD-10-CM

## 2022-12-29 ENCOUNTER — Other Ambulatory Visit: Payer: Self-pay

## 2022-12-29 ENCOUNTER — Ambulatory Visit
Admission: RE | Admit: 2022-12-29 | Discharge: 2022-12-29 | Disposition: A | Discharge status: Home / Self Care | Payer: Medicare HMO | Source: Ambulatory Visit | Attending: Radiation Oncology | Admitting: Radiation Oncology

## 2022-12-29 ENCOUNTER — Ambulatory Visit
Admission: RE | Admit: 2022-12-29 | Discharge: 2022-12-29 | Disposition: A | Discharge status: Home / Self Care | Payer: Medicare HMO | Source: Ambulatory Visit | Attending: Radiation Oncology

## 2022-12-29 DIAGNOSIS — Z170 Estrogen receptor positive status [ER+]: Secondary | ICD-10-CM | POA: Diagnosis not present

## 2022-12-29 DIAGNOSIS — Z510 Encounter for antineoplastic radiation therapy: Secondary | ICD-10-CM | POA: Diagnosis not present

## 2022-12-29 DIAGNOSIS — C50411 Malignant neoplasm of upper-outer quadrant of right female breast: Secondary | ICD-10-CM | POA: Diagnosis not present

## 2022-12-29 LAB — RAD ONC ARIA SESSION SUMMARY
Course Elapsed Days: 0
Plan Fractions Treated to Date: 1
Plan Prescribed Dose Per Fraction: 2.67 Gy
Plan Total Fractions Prescribed: 15
Plan Total Prescribed Dose: 40.05 Gy
Reference Point Dosage Given to Date: 2.67 Gy
Reference Point Session Dosage Given: 2.67 Gy
Session Number: 1

## 2022-12-29 MED ORDER — RADIAPLEXRX EX GEL: Freq: Once | CUTANEOUS | Status: AC

## 2022-12-29 MED ORDER — ALRA NON-METALLIC DEODORANT (RAD-ONC)
1.0000 | Freq: Once | TOPICAL | Status: AC
  Administered 2022-12-29: 1 via TOPICAL

## 2022-12-30 ENCOUNTER — Ambulatory Visit
Admission: RE | Admit: 2022-12-30 | Discharge: 2022-12-30 | Disposition: A | Discharge status: Home / Self Care | Payer: Medicare HMO | Source: Ambulatory Visit | Attending: Radiation Oncology | Admitting: Radiation Oncology

## 2022-12-30 ENCOUNTER — Other Ambulatory Visit: Payer: Self-pay

## 2022-12-30 DIAGNOSIS — Z510 Encounter for antineoplastic radiation therapy: Secondary | ICD-10-CM | POA: Diagnosis not present

## 2022-12-30 DIAGNOSIS — Z170 Estrogen receptor positive status [ER+]: Secondary | ICD-10-CM | POA: Diagnosis not present

## 2022-12-30 DIAGNOSIS — C50411 Malignant neoplasm of upper-outer quadrant of right female breast: Secondary | ICD-10-CM | POA: Diagnosis not present

## 2022-12-30 LAB — RAD ONC ARIA SESSION SUMMARY
Course Elapsed Days: 1
Plan Fractions Treated to Date: 2
Plan Prescribed Dose Per Fraction: 2.67 Gy
Plan Total Fractions Prescribed: 15
Plan Total Prescribed Dose: 40.05 Gy
Reference Point Dosage Given to Date: 5.34 Gy
Reference Point Session Dosage Given: 2.67 Gy
Session Number: 2

## 2022-12-31 ENCOUNTER — Ambulatory Visit
Admission: RE | Admit: 2022-12-31 | Discharge: 2022-12-31 | Disposition: A | Discharge status: Home / Self Care | Payer: Medicare HMO | Source: Ambulatory Visit | Attending: Radiation Oncology

## 2022-12-31 ENCOUNTER — Other Ambulatory Visit: Payer: Self-pay

## 2022-12-31 DIAGNOSIS — C50411 Malignant neoplasm of upper-outer quadrant of right female breast: Secondary | ICD-10-CM | POA: Diagnosis not present

## 2022-12-31 DIAGNOSIS — Z510 Encounter for antineoplastic radiation therapy: Secondary | ICD-10-CM | POA: Diagnosis not present

## 2022-12-31 DIAGNOSIS — Z170 Estrogen receptor positive status [ER+]: Secondary | ICD-10-CM | POA: Diagnosis not present

## 2022-12-31 LAB — RAD ONC ARIA SESSION SUMMARY
Course Elapsed Days: 2
Plan Fractions Treated to Date: 3
Plan Prescribed Dose Per Fraction: 2.67 Gy
Plan Total Fractions Prescribed: 15
Plan Total Prescribed Dose: 40.05 Gy
Reference Point Dosage Given to Date: 8.01 Gy
Reference Point Session Dosage Given: 2.67 Gy
Session Number: 3

## 2023-01-01 ENCOUNTER — Ambulatory Visit
Admission: RE | Admit: 2023-01-01 | Discharge: 2023-01-01 | Disposition: A | Discharge status: Home / Self Care | Payer: Medicare HMO | Source: Ambulatory Visit | Attending: Radiation Oncology | Admitting: Radiation Oncology

## 2023-01-01 ENCOUNTER — Other Ambulatory Visit: Payer: Self-pay

## 2023-01-01 DIAGNOSIS — Z510 Encounter for antineoplastic radiation therapy: Secondary | ICD-10-CM | POA: Diagnosis not present

## 2023-01-01 DIAGNOSIS — C50411 Malignant neoplasm of upper-outer quadrant of right female breast: Secondary | ICD-10-CM | POA: Diagnosis not present

## 2023-01-01 DIAGNOSIS — Z170 Estrogen receptor positive status [ER+]: Secondary | ICD-10-CM | POA: Diagnosis not present

## 2023-01-01 LAB — RAD ONC ARIA SESSION SUMMARY
Course Elapsed Days: 3
Plan Fractions Treated to Date: 4
Plan Prescribed Dose Per Fraction: 2.67 Gy
Plan Total Fractions Prescribed: 15
Plan Total Prescribed Dose: 40.05 Gy
Reference Point Dosage Given to Date: 10.68 Gy
Reference Point Session Dosage Given: 2.67 Gy
Session Number: 4

## 2023-01-02 ENCOUNTER — Ambulatory Visit
Admission: RE | Admit: 2023-01-02 | Discharge: 2023-01-02 | Disposition: A | Discharge status: Home / Self Care | Payer: Medicare HMO | Source: Ambulatory Visit | Attending: Radiation Oncology | Admitting: Radiation Oncology

## 2023-01-02 ENCOUNTER — Other Ambulatory Visit: Payer: Self-pay

## 2023-01-02 DIAGNOSIS — Z170 Estrogen receptor positive status [ER+]: Secondary | ICD-10-CM | POA: Insufficient documentation

## 2023-01-02 DIAGNOSIS — C50411 Malignant neoplasm of upper-outer quadrant of right female breast: Secondary | ICD-10-CM | POA: Insufficient documentation

## 2023-01-02 DIAGNOSIS — Z510 Encounter for antineoplastic radiation therapy: Secondary | ICD-10-CM | POA: Insufficient documentation

## 2023-01-02 LAB — RAD ONC ARIA SESSION SUMMARY
Course Elapsed Days: 4
Plan Fractions Treated to Date: 5
Plan Prescribed Dose Per Fraction: 2.67 Gy
Plan Total Fractions Prescribed: 15
Plan Total Prescribed Dose: 40.05 Gy
Reference Point Dosage Given to Date: 13.35 Gy
Reference Point Session Dosage Given: 2.67 Gy
Session Number: 5

## 2023-01-05 ENCOUNTER — Ambulatory Visit
Admission: RE | Admit: 2023-01-05 | Discharge: 2023-01-05 | Disposition: A | Discharge status: Home / Self Care | Payer: Medicare HMO | Source: Ambulatory Visit | Attending: Radiation Oncology

## 2023-01-05 ENCOUNTER — Other Ambulatory Visit: Payer: Self-pay

## 2023-01-05 ENCOUNTER — Ambulatory Visit: Payer: Medicare HMO

## 2023-01-05 DIAGNOSIS — Z170 Estrogen receptor positive status [ER+]: Secondary | ICD-10-CM

## 2023-01-05 DIAGNOSIS — C50411 Malignant neoplasm of upper-outer quadrant of right female breast: Secondary | ICD-10-CM | POA: Diagnosis not present

## 2023-01-05 DIAGNOSIS — Z510 Encounter for antineoplastic radiation therapy: Secondary | ICD-10-CM | POA: Diagnosis not present

## 2023-01-05 LAB — RAD ONC ARIA SESSION SUMMARY
Course Elapsed Days: 7
Plan Fractions Treated to Date: 6
Plan Prescribed Dose Per Fraction: 2.67 Gy
Plan Total Fractions Prescribed: 15
Plan Total Prescribed Dose: 40.05 Gy
Reference Point Dosage Given to Date: 16.02 Gy
Reference Point Session Dosage Given: 2.67 Gy
Session Number: 6

## 2023-01-06 ENCOUNTER — Ambulatory Visit
Admission: RE | Admit: 2023-01-06 | Discharge: 2023-01-06 | Disposition: A | Discharge status: Home / Self Care | Payer: Medicare HMO | Source: Ambulatory Visit | Attending: Radiation Oncology

## 2023-01-06 ENCOUNTER — Other Ambulatory Visit: Payer: Self-pay

## 2023-01-06 DIAGNOSIS — Z170 Estrogen receptor positive status [ER+]: Secondary | ICD-10-CM | POA: Diagnosis not present

## 2023-01-06 DIAGNOSIS — Z510 Encounter for antineoplastic radiation therapy: Secondary | ICD-10-CM | POA: Diagnosis not present

## 2023-01-06 DIAGNOSIS — C50411 Malignant neoplasm of upper-outer quadrant of right female breast: Secondary | ICD-10-CM | POA: Diagnosis not present

## 2023-01-06 LAB — RAD ONC ARIA SESSION SUMMARY
Course Elapsed Days: 8
Plan Fractions Treated to Date: 7
Plan Prescribed Dose Per Fraction: 2.67 Gy
Plan Total Fractions Prescribed: 15
Plan Total Prescribed Dose: 40.05 Gy
Reference Point Dosage Given to Date: 18.69 Gy
Reference Point Session Dosage Given: 2.67 Gy
Session Number: 7

## 2023-01-07 ENCOUNTER — Ambulatory Visit
Admission: RE | Admit: 2023-01-07 | Discharge: 2023-01-07 | Disposition: A | Discharge status: Home / Self Care | Payer: Medicare HMO | Source: Ambulatory Visit | Attending: Radiation Oncology

## 2023-01-07 ENCOUNTER — Other Ambulatory Visit: Payer: Self-pay

## 2023-01-07 DIAGNOSIS — Z170 Estrogen receptor positive status [ER+]: Secondary | ICD-10-CM | POA: Diagnosis not present

## 2023-01-07 DIAGNOSIS — C50411 Malignant neoplasm of upper-outer quadrant of right female breast: Secondary | ICD-10-CM | POA: Diagnosis not present

## 2023-01-07 DIAGNOSIS — Z510 Encounter for antineoplastic radiation therapy: Secondary | ICD-10-CM | POA: Diagnosis not present

## 2023-01-07 LAB — RAD ONC ARIA SESSION SUMMARY
Course Elapsed Days: 9
Plan Fractions Treated to Date: 8
Plan Prescribed Dose Per Fraction: 2.67 Gy
Plan Total Fractions Prescribed: 15
Plan Total Prescribed Dose: 40.05 Gy
Reference Point Dosage Given to Date: 21.36 Gy
Reference Point Session Dosage Given: 2.67 Gy
Session Number: 8

## 2023-01-08 ENCOUNTER — Other Ambulatory Visit: Payer: Self-pay

## 2023-01-08 ENCOUNTER — Ambulatory Visit
Admission: RE | Admit: 2023-01-08 | Discharge: 2023-01-08 | Disposition: A | Discharge status: Home / Self Care | Payer: Medicare HMO | Source: Ambulatory Visit | Attending: Radiation Oncology | Admitting: Radiation Oncology

## 2023-01-08 DIAGNOSIS — C50411 Malignant neoplasm of upper-outer quadrant of right female breast: Secondary | ICD-10-CM | POA: Diagnosis not present

## 2023-01-08 DIAGNOSIS — Z510 Encounter for antineoplastic radiation therapy: Secondary | ICD-10-CM | POA: Diagnosis not present

## 2023-01-08 DIAGNOSIS — Z170 Estrogen receptor positive status [ER+]: Secondary | ICD-10-CM | POA: Diagnosis not present

## 2023-01-08 LAB — RAD ONC ARIA SESSION SUMMARY
Course Elapsed Days: 10
Plan Fractions Treated to Date: 9
Plan Prescribed Dose Per Fraction: 2.67 Gy
Plan Total Fractions Prescribed: 15
Plan Total Prescribed Dose: 40.05 Gy
Reference Point Dosage Given to Date: 24.03 Gy
Reference Point Session Dosage Given: 2.67 Gy
Session Number: 9

## 2023-01-09 ENCOUNTER — Telehealth: Payer: Self-pay

## 2023-01-09 ENCOUNTER — Ambulatory Visit
Admission: RE | Admit: 2023-01-09 | Discharge: 2023-01-09 | Disposition: A | Discharge status: Home / Self Care | Payer: Medicare HMO | Source: Ambulatory Visit | Attending: Radiation Oncology

## 2023-01-09 ENCOUNTER — Other Ambulatory Visit: Payer: Self-pay

## 2023-01-09 DIAGNOSIS — C50411 Malignant neoplasm of upper-outer quadrant of right female breast: Secondary | ICD-10-CM | POA: Diagnosis not present

## 2023-01-09 DIAGNOSIS — Z510 Encounter for antineoplastic radiation therapy: Secondary | ICD-10-CM | POA: Diagnosis not present

## 2023-01-09 DIAGNOSIS — Z170 Estrogen receptor positive status [ER+]: Secondary | ICD-10-CM | POA: Diagnosis not present

## 2023-01-09 LAB — RAD ONC ARIA SESSION SUMMARY
Course Elapsed Days: 11
Plan Fractions Treated to Date: 10
Plan Prescribed Dose Per Fraction: 2.67 Gy
Plan Total Fractions Prescribed: 15
Plan Total Prescribed Dose: 40.05 Gy
Reference Point Dosage Given to Date: 26.7 Gy
Reference Point Session Dosage Given: 2.67 Gy
Session Number: 10

## 2023-01-09 NOTE — Telephone Encounter (Signed)
Called and left VM for patient to call back to schedule an appointment.

## 2023-01-10 ENCOUNTER — Telehealth: Payer: Self-pay | Admitting: Dietician

## 2023-01-10 NOTE — Telephone Encounter (Signed)
Called and left VM with appointment details.

## 2023-01-12 ENCOUNTER — Other Ambulatory Visit: Payer: Self-pay

## 2023-01-12 ENCOUNTER — Ambulatory Visit: Payer: Medicare HMO | Admitting: Radiation Oncology

## 2023-01-12 ENCOUNTER — Ambulatory Visit
Admission: RE | Admit: 2023-01-12 | Discharge: 2023-01-12 | Disposition: A | Discharge status: Home / Self Care | Payer: Medicare HMO | Source: Ambulatory Visit | Attending: Radiation Oncology | Admitting: Radiation Oncology

## 2023-01-12 ENCOUNTER — Ambulatory Visit (HOSPITAL_COMMUNITY)
Admission: RE | Admit: 2023-01-12 | Discharge: 2023-01-12 | Disposition: A | Discharge status: Home / Self Care | Payer: Medicare HMO | Source: Ambulatory Visit | Attending: Radiation Oncology | Admitting: Radiation Oncology

## 2023-01-12 VITALS — BP 127/61 | HR 63 | Temp 97.6°F | Resp 19 | Ht 63.0 in | Wt 128.0 lb

## 2023-01-12 DIAGNOSIS — C50411 Malignant neoplasm of upper-outer quadrant of right female breast: Secondary | ICD-10-CM | POA: Insufficient documentation

## 2023-01-12 DIAGNOSIS — R0602 Shortness of breath: Secondary | ICD-10-CM | POA: Diagnosis not present

## 2023-01-12 DIAGNOSIS — Z170 Estrogen receptor positive status [ER+]: Secondary | ICD-10-CM | POA: Diagnosis not present

## 2023-01-12 DIAGNOSIS — Z510 Encounter for antineoplastic radiation therapy: Secondary | ICD-10-CM | POA: Diagnosis not present

## 2023-01-12 LAB — RAD ONC ARIA SESSION SUMMARY
Course Elapsed Days: 14
Plan Fractions Treated to Date: 11
Plan Prescribed Dose Per Fraction: 2.67 Gy
Plan Total Fractions Prescribed: 15
Plan Total Prescribed Dose: 40.05 Gy
Reference Point Dosage Given to Date: 29.37 Gy
Reference Point Session Dosage Given: 2.67 Gy
Session Number: 11

## 2023-01-13 ENCOUNTER — Other Ambulatory Visit: Payer: Self-pay

## 2023-01-13 ENCOUNTER — Ambulatory Visit
Admission: RE | Admit: 2023-01-13 | Discharge: 2023-01-13 | Disposition: A | Discharge status: Home / Self Care | Payer: Medicare HMO | Source: Ambulatory Visit | Attending: Radiation Oncology | Admitting: Radiation Oncology

## 2023-01-13 DIAGNOSIS — Z510 Encounter for antineoplastic radiation therapy: Secondary | ICD-10-CM | POA: Diagnosis not present

## 2023-01-13 DIAGNOSIS — Z170 Estrogen receptor positive status [ER+]: Secondary | ICD-10-CM | POA: Diagnosis not present

## 2023-01-13 DIAGNOSIS — C50411 Malignant neoplasm of upper-outer quadrant of right female breast: Secondary | ICD-10-CM | POA: Diagnosis not present

## 2023-01-13 LAB — RAD ONC ARIA SESSION SUMMARY
Course Elapsed Days: 15
Plan Fractions Treated to Date: 12
Plan Prescribed Dose Per Fraction: 2.67 Gy
Plan Total Fractions Prescribed: 15
Plan Total Prescribed Dose: 40.05 Gy
Reference Point Dosage Given to Date: 32.04 Gy
Reference Point Session Dosage Given: 2.67 Gy
Session Number: 12

## 2023-01-14 ENCOUNTER — Other Ambulatory Visit: Payer: Self-pay

## 2023-01-14 ENCOUNTER — Ambulatory Visit
Admission: RE | Admit: 2023-01-14 | Discharge: 2023-01-14 | Disposition: A | Discharge status: Home / Self Care | Payer: Medicare HMO | Source: Ambulatory Visit | Attending: Radiation Oncology

## 2023-01-14 DIAGNOSIS — Z510 Encounter for antineoplastic radiation therapy: Secondary | ICD-10-CM | POA: Diagnosis not present

## 2023-01-14 DIAGNOSIS — C50411 Malignant neoplasm of upper-outer quadrant of right female breast: Secondary | ICD-10-CM | POA: Diagnosis not present

## 2023-01-14 DIAGNOSIS — Z170 Estrogen receptor positive status [ER+]: Secondary | ICD-10-CM | POA: Diagnosis not present

## 2023-01-14 LAB — RAD ONC ARIA SESSION SUMMARY
Course Elapsed Days: 16
Plan Fractions Treated to Date: 13
Plan Prescribed Dose Per Fraction: 2.67 Gy
Plan Total Fractions Prescribed: 15
Plan Total Prescribed Dose: 40.05 Gy
Reference Point Dosage Given to Date: 34.71 Gy
Reference Point Session Dosage Given: 2.67 Gy
Session Number: 13

## 2023-01-15 ENCOUNTER — Inpatient Hospital Stay: Payer: Medicare HMO | Admitting: Hematology and Oncology

## 2023-01-15 ENCOUNTER — Ambulatory Visit
Admission: RE | Admit: 2023-01-15 | Discharge: 2023-01-15 | Disposition: A | Discharge status: Home / Self Care | Payer: Medicare HMO | Source: Ambulatory Visit | Attending: Radiation Oncology | Admitting: Radiation Oncology

## 2023-01-15 ENCOUNTER — Other Ambulatory Visit: Payer: Self-pay

## 2023-01-15 VITALS — BP 137/68 | HR 88 | Temp 97.5°F | Resp 18 | Wt 131.1 lb

## 2023-01-15 DIAGNOSIS — Z823 Family history of stroke: Secondary | ICD-10-CM | POA: Insufficient documentation

## 2023-01-15 DIAGNOSIS — C50411 Malignant neoplasm of upper-outer quadrant of right female breast: Secondary | ICD-10-CM | POA: Insufficient documentation

## 2023-01-15 DIAGNOSIS — Z170 Estrogen receptor positive status [ER+]: Secondary | ICD-10-CM | POA: Insufficient documentation

## 2023-01-15 DIAGNOSIS — Z510 Encounter for antineoplastic radiation therapy: Secondary | ICD-10-CM | POA: Diagnosis not present

## 2023-01-15 LAB — RAD ONC ARIA SESSION SUMMARY
Course Elapsed Days: 17
Plan Fractions Treated to Date: 14
Plan Prescribed Dose Per Fraction: 2.67 Gy
Plan Total Fractions Prescribed: 15
Plan Total Prescribed Dose: 40.05 Gy
Reference Point Dosage Given to Date: 37.38 Gy
Reference Point Session Dosage Given: 2.67 Gy
Session Number: 14

## 2023-01-15 MED ORDER — TAMOXIFEN CITRATE 20 MG PO TABS: 20.0000 mg | ORAL_TABLET | Freq: Every day | ORAL | 3 refills | Status: DC

## 2023-01-15 NOTE — Assessment & Plan Note (Signed)
This is a very pleasant 89 year old female patient with newly diagnosed right breast grade 3 IDC, ER 40% positive weak staining, PR 10% positive weak to moderate staining, Ki-67 of 70% and HER2 1+ referred to breast M[facility] for recommendations.  Patient is a robust 89 and has been physically active, she is a retired Engineer, civil (consulting) as well.  No concerns on review of systems.  Physical examination with right breast upper outer quadrant mass measuring approximately 3-1/2 cm with no palpable regional adenopathy.  Left breast has chronically inverted nipple since her last biopsy.  Given near triple negative tumor, we have discussed about considering upfront surgery followed by adjuvant radiation.  Although chemotherapy would have been standard of care for near triple negative/functional triple negative tumors, given her age, we have discussed that chemotherapy may not be tolerated very well and she is in complete agreement with this.  She is now undergoing adjuvant radiation, getting ready to complete radiation.  We have discussed that she had weak ER positive tumor and PR positive tumor.  Hence after completing radiation, we have discussed about considering antiestrogen therapy.  If she has adverse effects or cannot tolerate it well, we will discontinue it.  Given her underlying bone density issues and no evidence of family history of VTE or personal history of DVT/VTE, we have discussed about considering tamoxifen.  Have reviewed the mechanism of action, adverse effects of tamoxifen, including but not limited to fatigue, postmenopausal symptoms such as hot flashes, vaginal discharge, risk of DVT/PE, endometrial hyperplasia, endometrial carcinoma but benefit on bone density.  She is willing to try this.  She will return to clinic in approximately 4 months for survivorship visit and toxicity check.  With regards to the shortness of breath on exertion which is progressively getting worse, have ordered an echo, also sent an in basket  message to her primary care physician to consider further evaluation of this.  I have asked her to call me if she does not hear back from her PCP so I can send her to cardiology and she expressed understanding.

## 2023-01-15 NOTE — Progress Notes (Signed)
[facility] CONSULT NOTE  Patient Care Team: Merri Brunette, MD as PCP - General (Internal Medicine) Pershing Proud, RN as Oncology Nurse Navigator Donnelly Angelica, RN as Oncology Nurse Navigator Emelia Loron, MD as Consulting Physician (General Surgery) Rachel Moulds, MD as Consulting Physician (Hematology and Oncology) Lonie Peak, MD as Attending Physician (Radiation Oncology)  CHIEF COMPLAINTS/PURPOSE OF CONSULTATION:  Newly diagnosed breast cancer  ASSESSMENT & PLAN:  Malignant neoplasm of upper-outer quadrant of right breast in female, estrogen receptor positive [facility]) This is a very pleasant 89 year old female patient with newly diagnosed right breast grade 3 IDC, ER 40% positive weak staining, PR 10% positive weak to moderate staining, Ki-67 of 70% and HER2 1+ referred to breast MDC for recommendations.  Patient is a robust 89 and has been physically active, she is a retired Engineer, civil (consulting) as well.  No concerns on review of systems.  Physical examination with right breast upper outer quadrant mass measuring approximately 3-1/2 cm with no palpable regional adenopathy.  Left breast has chronically inverted nipple since her last biopsy.  Given near triple negative tumor, we have discussed about considering upfront surgery followed by adjuvant radiation.  Although chemotherapy would have been standard of care for near triple negative/functional triple negative tumors, given her age, we have discussed that chemotherapy may not be tolerated very well and she is in complete agreement with this.  She is now undergoing adjuvant radiation, getting ready to complete radiation.  We have discussed that she had weak ER positive tumor and PR positive tumor.  Hence after completing radiation, we have discussed about considering antiestrogen therapy.  If she has adverse effects or cannot tolerate it well, we will discontinue it.  Given her underlying bone density issues and no evidence of  family history of VTE or personal history of DVT/VTE, we have discussed about considering tamoxifen.  Have reviewed the mechanism of action, adverse effects of tamoxifen, including but not limited to fatigue, postmenopausal symptoms such as hot flashes, vaginal discharge, risk of DVT/PE, endometrial hyperplasia, endometrial carcinoma but benefit on bone density.  She is willing to try this.  She will return to clinic in approximately 4 months for survivorship visit and toxicity check.  With regards to the shortness of breath on exertion which is progressively getting worse, have ordered an echo, also sent an in basket message to her primary care physician to consider further evaluation of this.  I have asked her to call me if she does not hear back from her PCP so I can send her to cardiology and she expressed understanding.   Orders Placed This Encounter  Procedures   ECHOCARDIOGRAM COMPLETE    Standing Status:   Future    Standing Expiration Date:   01/15/2024    Order Specific Question:   Where should this test be performed    Answer:   [facility]    Order Specific Question:   Perflutren DEFINITY (image enhancing agent) should be administered unless hypersensitivity or allergy exist    Answer:   Administer Perflutren    Order Specific Question:   Is a special reader required? (athlete or structural heart)    Answer:   No    Order Specific Question:   Does this study need to be read by the Structural team/Level 3 readers?    Answer:   No    Order Specific Question:   Reason for exam-Echo    Answer:   Chemo  Z09  HISTORY OF PRESENTING ILLNESS:  Charryse Rabanal 89 y.o. female is here because of newly diagnosed breast cancer  This is a very pleasant 89 year old female patient with past medical history significant for type 2 diabetes mellitus, hypertension referred to breast MDC for new diagnosis of right-sided breast cancer.  Patient arrived to the appointment by herself.  She lives  independently, has 1 child in town and has 3 other children living close by. She tells me that she has not noticed this lump.  She went to her PCP for another cyst in the breast and they have found his large breast mass and this prompted further investigation.  8/28 she had diagnostic mammogram and this showed highly suspicious mass at the palpable site of concern in the right breast at 12:00 measuring 3.4 cm.  Ultrasound confirmed this findings.  Pathology from the right breast showed invasive poorly differentiated ductal adenocarcinoma, grade 3, prognostic showed ER 40% positive weak staining, PR 10% positive weak to moderate staining, Ki-67 of 70% and HER2 1+  She is now undergoing adj radiation. She complains of worsening SOB, loss of appetite, ongoing issues with DM. She says he had an x ray but didn't hear back about it.  With regards to the diabetes issues, these are chronic.  She will also talk to her primary care physician about it. She has never had a cardiology evaluation.  MEDICAL HISTORY:  Past Medical History:  Diagnosis Date   Abnormality of left breast on screening mammogram 01/04/2019   Arthritis    mild -generalized   Cancer (HCC)    Complication of anesthesia 07-19-12   note with chart DOS 01-11-86, "small mouth"   Diabetes mellitus    TYPE 2   GERD (gastroesophageal reflux disease)    High cholesterol    Hypertension    Postmenopausal    Varicose veins of leg with swelling 2012   bilateral lower extremity Varicose Veins w/ swelling/pain   Vitamin D deficiency     SURGICAL HISTORY: Past Surgical History:  Procedure Laterality Date   APPENDECTOMY  1965   BREAST BIOPSY Right 11/04/2022   [place] RT BREAST BX W LOC DEV 1ST LESION IMG BX SPEC US GUIDE 11/04/2022 [facility]AMMO[facility]   BREAST LUMPECTOMY Right 11/20/2022   Procedure: RIGHT BREAST LUMPECTOMY;  Surgeon: Emelia Loron, MD;  Location: [facility] OR;  Service: General;  Laterality: Right;   BREAST LUMPECTOMY WITH  RADIOACTIVE SEED LOCALIZATION Left 01/04/2019   Procedure: LEFT BREAST LUMPECTOMY WITH RADIOACTIVE SEED LOCALIZATION;  Surgeon: Claud Kelp, MD;  Location: [facility] OR;  Service: General;  Laterality: Left;   CATARACT EXTRACTION, BILATERAL  2007   CHOLECYSTECTOMY  1980   EYE SURGERY     KNEE ARTHROSCOPY  2008   right   PUBOVAGINAL SLING N/A 07/23/2012   Procedure: Pubo-Vaginal Solyx Sling;  Surgeon: Kathi Ludwig, MD;  Location: [facility] ORS;  Service: Urology;  Laterality: N/A;   SIGMOIDOSCOPY  2004   TONSILLECTOMY     TUBAL LIGATION  1965   VAGINAL PROLAPSE REPAIR N/A 07/23/2012   Procedure: UP HOLD LITE ANTERIOR VAULT SUSPENSION;  Surgeon: Kathi Ludwig, MD;  Location: [facility] ORS;  Service: Urology;  Laterality: N/A;    SOCIAL HISTORY: Social History   Socioeconomic History   Marital status: Widowed    Spouse name: Not on file   Number of children: Not on file   Years of education: Not on file   Highest education level: Not on file  Occupational History  Not on file  Tobacco Use   Smoking status: Never   Smokeless tobacco: Never  Substance and Sexual Activity   Alcohol use: Yes    Comment: WINE-monthly   Drug use: No   Sexual activity: Never  Other Topics Concern   Not on file  Social History Narrative   Not on file   Social Determinants of Health   Financial Resource Strain: Not on file  Food Insecurity: No Food Insecurity (11/20/2022)   Hunger Vital Sign    Worried About Running Out of Food in the Last Year: Never true    Ran Out of Food in the Last Year: Never true  Transportation Needs: No Transportation Needs (11/20/2022)   PRAPARE - Administrator, Civil Service (Medical): No    Lack of Transportation (Non-Medical): No  Physical Activity: Not on file  Stress: Not on file  Social Connections: Not on file  Intimate Partner Violence: Not At Risk (11/20/2022)   Humiliation, Afraid, Rape, and Kick questionnaire    Fear of Current or Ex-Partner: No     Emotionally Abused: No    Physically Abused: No    Sexually Abused: No    FAMILY HISTORY: Family History  Problem Relation Age of Onset   Stroke Mother    Stroke Maternal Grandmother    Hyperlipidemia Maternal Aunt    Breast cancer Neg Hx     ALLERGIES:  is allergic to codeine, lorcet [hydrocodone-acetaminophen], and sulfa antibiotics.  MEDICATIONS:  Current Outpatient Medications  Medication Sig Dispense Refill   [START ON 02/02/2023] tamoxifen (NOLVADEX) 20 MG tablet Take 1 tablet (20 mg total) by mouth daily. 90 tablet 3   acetaminophen (TYLENOL) 325 MG tablet Take 650 mg by mouth every 6 (six) hours as needed for moderate pain (pain).     aspirin EC 81 MG tablet Take 81 mg by mouth daily after supper.     Calcium Citrate-Vitamin D (CALCIUM CITRATE + D3 PO) Take 1 tablet by mouth every evening.     cholecalciferol (VITAMIN D) 1000 UNITS tablet Take 1,000 Units by mouth in the morning, at noon, and at bedtime.     glucose blood test strip 1 each by Other route as needed. Use as instructed     hydrocortisone cream 1 % Apply 1 Application topically 2 (two) times daily as needed (skin irritation/itching).     losartan (COZAAR) 50 MG tablet Take 50 mg by mouth every evening.     metFORMIN (GLUCOPHAGE-XR) 500 MG 24 hr tablet Take 1,000 mg by mouth 2 (two) times daily.     rOPINIRole (REQUIP) 0.5 MG tablet Take 0.5 mg by mouth at bedtime.     No current facility-administered medications for this visit.     PHYSICAL EXAMINATION: ECOG PERFORMANCE STATUS: 0 - Asymptomatic  Vitals:   01/15/23 1015  BP: 137/68  Pulse: 88  Resp: 18  Temp: (!) 97.5 F (36.4 C)  SpO2: 96%   Filed Weights   01/15/23 1015  Weight: 131 lb 1.6 oz (59.5 kg)    GENERAL:alert, no distress and comfortable Chest: Clear to auscultation bilaterally No lower extremity edema Rest of the exam deferred in lieu of counseling  LABORATORY DATA:  I have reviewed the data as listed Lab Results   Component Value Date   WBC 5.6 11/12/2022   HGB 12.0 11/12/2022   HCT 36.6 11/12/2022   MCV 86.9 11/12/2022   PLT 220 11/12/2022     Chemistry  Component Value Date/Time   NA 142 11/12/2022 0747   K 4.1 11/12/2022 0747   CL 107 11/12/2022 0747   CO2 28 11/12/2022 0747   BUN 13 11/12/2022 0747   CREATININE 0.90 11/12/2022 0747      Component Value Date/Time   CALCIUM 9.7 11/12/2022 0747   ALKPHOS 66 11/12/2022 0747   AST 11 (L) 11/12/2022 0747   ALT 8 11/12/2022 0747   BILITOT 0.5 11/12/2022 0747       RADIOGRAPHIC STUDIES: I have personally reviewed the radiological images as listed and agreed with the findings in the report. No results found.  All questions were answered. The patient knows to call the clinic with any problems, questions or concerns. I spent 30 minutes in the care of this patient including H and P, review of records, counseling and coordination of care.     Rachel Moulds, MD 01/15/2023 2:40 PM

## 2023-01-16 ENCOUNTER — Ambulatory Visit
Admission: RE | Admit: 2023-01-16 | Discharge: 2023-01-16 | Disposition: A | Discharge status: Home / Self Care | Payer: Medicare HMO | Source: Ambulatory Visit | Attending: Radiation Oncology

## 2023-01-16 ENCOUNTER — Other Ambulatory Visit: Payer: Self-pay

## 2023-01-16 ENCOUNTER — Telehealth: Payer: Self-pay | Admitting: Hematology and Oncology

## 2023-01-16 ENCOUNTER — Other Ambulatory Visit: Payer: Self-pay | Admitting: *Deleted

## 2023-01-16 DIAGNOSIS — C50411 Malignant neoplasm of upper-outer quadrant of right female breast: Secondary | ICD-10-CM | POA: Diagnosis not present

## 2023-01-16 DIAGNOSIS — Z510 Encounter for antineoplastic radiation therapy: Secondary | ICD-10-CM | POA: Diagnosis not present

## 2023-01-16 DIAGNOSIS — Z170 Estrogen receptor positive status [ER+]: Secondary | ICD-10-CM | POA: Diagnosis not present

## 2023-01-16 LAB — RAD ONC ARIA SESSION SUMMARY
Course Elapsed Days: 18
Plan Fractions Treated to Date: 15
Plan Prescribed Dose Per Fraction: 2.67 Gy
Plan Total Fractions Prescribed: 15
Plan Total Prescribed Dose: 40.05 Gy
Reference Point Dosage Given to Date: 40.05 Gy
Reference Point Session Dosage Given: 2.67 Gy
Session Number: 15

## 2023-01-16 NOTE — Telephone Encounter (Signed)
Spoke with patient confirming upcoming appointment  

## 2023-01-16 NOTE — Progress Notes (Signed)
Patient made aware of CXR results. She voiced understanding.

## 2023-01-19 ENCOUNTER — Other Ambulatory Visit: Payer: Self-pay

## 2023-01-19 ENCOUNTER — Ambulatory Visit
Admission: RE | Admit: 2023-01-19 | Discharge: 2023-01-19 | Disposition: A | Discharge status: Home / Self Care | Payer: Medicare HMO | Source: Ambulatory Visit | Attending: Radiation Oncology | Admitting: Radiation Oncology

## 2023-01-19 ENCOUNTER — Ambulatory Visit
Admission: RE | Admit: 2023-01-19 | Discharge: 2023-01-19 | Disposition: A | Discharge status: Home / Self Care | Payer: Medicare HMO | Source: Ambulatory Visit | Attending: Radiation Oncology

## 2023-01-19 DIAGNOSIS — Z170 Estrogen receptor positive status [ER+]: Secondary | ICD-10-CM | POA: Diagnosis not present

## 2023-01-19 DIAGNOSIS — Z510 Encounter for antineoplastic radiation therapy: Secondary | ICD-10-CM | POA: Diagnosis not present

## 2023-01-19 DIAGNOSIS — C50411 Malignant neoplasm of upper-outer quadrant of right female breast: Secondary | ICD-10-CM | POA: Diagnosis not present

## 2023-01-19 LAB — RAD ONC ARIA SESSION SUMMARY
Course Elapsed Days: 21
Plan Fractions Treated to Date: 1
Plan Prescribed Dose Per Fraction: 2 Gy
Plan Total Fractions Prescribed: 5
Plan Total Prescribed Dose: 10 Gy
Reference Point Dosage Given to Date: 2 Gy
Reference Point Session Dosage Given: 2 Gy
Session Number: 16

## 2023-01-20 ENCOUNTER — Ambulatory Visit
Admission: RE | Admit: 2023-01-20 | Discharge: 2023-01-20 | Disposition: A | Discharge status: Home / Self Care | Payer: Medicare HMO | Source: Ambulatory Visit | Attending: Radiation Oncology

## 2023-01-20 ENCOUNTER — Other Ambulatory Visit: Payer: Self-pay

## 2023-01-20 ENCOUNTER — Inpatient Hospital Stay: Payer: Medicare HMO | Admitting: Dietician

## 2023-01-20 DIAGNOSIS — C50411 Malignant neoplasm of upper-outer quadrant of right female breast: Secondary | ICD-10-CM | POA: Diagnosis not present

## 2023-01-20 DIAGNOSIS — Z170 Estrogen receptor positive status [ER+]: Secondary | ICD-10-CM | POA: Diagnosis not present

## 2023-01-20 DIAGNOSIS — Z510 Encounter for antineoplastic radiation therapy: Secondary | ICD-10-CM | POA: Diagnosis not present

## 2023-01-20 LAB — RAD ONC ARIA SESSION SUMMARY
Course Elapsed Days: 22
Plan Fractions Treated to Date: 2
Plan Prescribed Dose Per Fraction: 2 Gy
Plan Total Fractions Prescribed: 5
Plan Total Prescribed Dose: 10 Gy
Reference Point Dosage Given to Date: 4 Gy
Reference Point Session Dosage Given: 2 Gy
Session Number: 17

## 2023-01-20 NOTE — Progress Notes (Signed)
Nutrition Assessment   Reason for Assessment: Referral   ASSESSMENT: 89 year old female with malignant neoplasm of right breast, estrogen receptor positive. S/p right lumpectomy. She is currently receiving radiation under the care of Dr. Basilio Cairo.  Past medical history includes DM2, HLD  Met with patient in office prior to radiation. Patient reports tolerating treatment well overall. She has been more fatigued recently. Patient endorses skin breakdown from radiation. She reports discomfort with certain movements of her arm. Patient is eating 2-3 small meals. She has a smoothie (F/V, yogurt or Winona Legato) a few times/week. Recalls chicken salad sandwich (grapes, slivered almond) for lunch. She had leftover chicken pie and strawberries for dinner. Patient drinks Hint water throughout the day. She denies nausea, vomiting, diarrhea, constipation.    Nutrition Focused Physical Exam: deferred   Medications: Calcium citrate + D3, vit D, cozaar, metformin, requip, tamoxifen   Labs: no recent labs for review   Anthropometrics: Weights have decreased 8 lbs (6%) from usual weight in 2 months which is insignificant for time frame, however concerning given advanced age with chronic illness undergoing therapy  Height: 5'3" Weight: 131 lb 1.6 oz UBW: 139 lb 14.4 oz (9/11) BMI: 23.22    NUTRITION DIAGNOSIS: Food and nutrition related knowledge deficit related to cancer as evidenced by no prior need for associated nutrition information   INTERVENTION:  Educated on importance of adequate calorie and protein energy intake to preserve LBM and support healing post treatment Discussed foods with protein, recommend protein source at every meal Continue drinking Boost HP/equivalent - recommend drinking daily  Ensure + CIB samples along with coupons provided Contact information    MONITORING, EVALUATION, GOAL: Patient will tolerate increased calories and protein to minimize further wt loss   Next  Visit: No follow-up scheduled at this time. Patient encouraged to contact with nutrition questions/concerns

## 2023-01-21 ENCOUNTER — Other Ambulatory Visit: Payer: Self-pay

## 2023-01-21 ENCOUNTER — Ambulatory Visit
Admission: RE | Admit: 2023-01-21 | Discharge: 2023-01-21 | Disposition: A | Discharge status: Home / Self Care | Payer: Medicare HMO | Source: Ambulatory Visit | Attending: Radiation Oncology

## 2023-01-21 DIAGNOSIS — C50411 Malignant neoplasm of upper-outer quadrant of right female breast: Secondary | ICD-10-CM | POA: Diagnosis not present

## 2023-01-21 DIAGNOSIS — Z170 Estrogen receptor positive status [ER+]: Secondary | ICD-10-CM | POA: Diagnosis not present

## 2023-01-21 DIAGNOSIS — Z510 Encounter for antineoplastic radiation therapy: Secondary | ICD-10-CM | POA: Diagnosis not present

## 2023-01-21 LAB — RAD ONC ARIA SESSION SUMMARY
Course Elapsed Days: 23
Plan Fractions Treated to Date: 3
Plan Prescribed Dose Per Fraction: 2 Gy
Plan Total Fractions Prescribed: 5
Plan Total Prescribed Dose: 10 Gy
Reference Point Dosage Given to Date: 6 Gy
Reference Point Session Dosage Given: 2 Gy
Session Number: 18

## 2023-01-22 ENCOUNTER — Other Ambulatory Visit: Payer: Self-pay

## 2023-01-22 ENCOUNTER — Ambulatory Visit
Admission: RE | Admit: 2023-01-22 | Discharge: 2023-01-22 | Disposition: A | Discharge status: Home / Self Care | Payer: Medicare HMO | Source: Ambulatory Visit | Attending: Radiation Oncology | Admitting: Radiation Oncology

## 2023-01-22 DIAGNOSIS — Z510 Encounter for antineoplastic radiation therapy: Secondary | ICD-10-CM | POA: Diagnosis not present

## 2023-01-22 DIAGNOSIS — Z170 Estrogen receptor positive status [ER+]: Secondary | ICD-10-CM | POA: Diagnosis not present

## 2023-01-22 DIAGNOSIS — C50411 Malignant neoplasm of upper-outer quadrant of right female breast: Secondary | ICD-10-CM | POA: Diagnosis not present

## 2023-01-22 LAB — RAD ONC ARIA SESSION SUMMARY
Course Elapsed Days: 24
Plan Fractions Treated to Date: 4
Plan Prescribed Dose Per Fraction: 2 Gy
Plan Total Fractions Prescribed: 5
Plan Total Prescribed Dose: 10 Gy
Reference Point Dosage Given to Date: 8 Gy
Reference Point Session Dosage Given: 2 Gy
Session Number: 19

## 2023-01-23 ENCOUNTER — Other Ambulatory Visit: Payer: Self-pay

## 2023-01-23 ENCOUNTER — Ambulatory Visit
Admission: RE | Admit: 2023-01-23 | Discharge: 2023-01-23 | Disposition: A | Discharge status: Home / Self Care | Payer: Medicare HMO | Source: Ambulatory Visit | Attending: Radiation Oncology | Admitting: Radiation Oncology

## 2023-01-23 DIAGNOSIS — Z510 Encounter for antineoplastic radiation therapy: Secondary | ICD-10-CM | POA: Diagnosis not present

## 2023-01-23 DIAGNOSIS — C50411 Malignant neoplasm of upper-outer quadrant of right female breast: Secondary | ICD-10-CM | POA: Diagnosis not present

## 2023-01-23 DIAGNOSIS — Z170 Estrogen receptor positive status [ER+]: Secondary | ICD-10-CM | POA: Diagnosis not present

## 2023-01-23 LAB — RAD ONC ARIA SESSION SUMMARY
Course Elapsed Days: 25
Plan Fractions Treated to Date: 5
Plan Prescribed Dose Per Fraction: 2 Gy
Plan Total Fractions Prescribed: 5
Plan Total Prescribed Dose: 10 Gy
Reference Point Dosage Given to Date: 10 Gy
Reference Point Session Dosage Given: 2 Gy
Session Number: 20

## 2023-01-27 NOTE — Radiation Completion Notes (Signed)
Patient Name: Melissa Glenn, Melissa Glenn MRN: [id number] Date of Birth: October 18, 1930 Referring Physician: Merri Brunette, M.D. Date of Service: 2023-01-27 Radiation Oncologist: Lonie Peak, M.D. [facility] [place]                             RADIATION ONCOLOGY END OF TREATMENT NOTE     Diagnosis: C50.411 Malignant neoplasm of upper-outer quadrant of right female breast Staging on 2022-11-12: Malignant neoplasm of upper-outer quadrant of right breast in female, estrogen receptor positive (HCC) T=cT2, N=cN0, M=cM0 Intent: Curative     ==========DELIVERED PLANS==========  First Treatment Date: 2022-12-29 - Last Treatment Date: 2023-01-23   Plan Name: Breast_R Site: Breast, Right Technique: 3D Mode: Photon Dose Per Fraction: 2.67 Gy Prescribed Dose (Delivered / Prescribed): 40.05 Gy / 40.05 Gy Prescribed Fxs (Delivered / Prescribed): 15 / 15   Plan Name: Breast_R_Bst Site: Breast, Right Technique: 3D Mode: Photon Dose Per Fraction: 2 Gy Prescribed Dose (Delivered / Prescribed): 10 Gy / 10 Gy Prescribed Fxs (Delivered / Prescribed): 5 / 5     ==========ON TREATMENT VISIT DATES========== 2022-12-29, 2023-01-05, 2023-01-12, 2023-01-19     ==========UPCOMING VISITS==========       ==========APPENDIX - ON TREATMENT VISIT NOTES==========   See weekly On Treatment Notes in Epic for details.

## 2023-02-03 ENCOUNTER — Ambulatory Visit (HOSPITAL_COMMUNITY)
Admission: RE | Admit: 2023-02-03 | Discharge: 2023-02-03 | Disposition: A | Discharge status: Home / Self Care | Payer: Medicare HMO | Source: Ambulatory Visit | Attending: Hematology and Oncology | Admitting: Hematology and Oncology

## 2023-02-03 DIAGNOSIS — Z0189 Encounter for other specified special examinations: Secondary | ICD-10-CM | POA: Diagnosis not present

## 2023-02-03 DIAGNOSIS — C50411 Malignant neoplasm of upper-outer quadrant of right female breast: Secondary | ICD-10-CM | POA: Diagnosis not present

## 2023-02-03 DIAGNOSIS — Z170 Estrogen receptor positive status [ER+]: Secondary | ICD-10-CM | POA: Insufficient documentation

## 2023-02-03 DIAGNOSIS — Z01818 Encounter for other preprocedural examination: Secondary | ICD-10-CM | POA: Diagnosis not present

## 2023-02-03 DIAGNOSIS — I081 Rheumatic disorders of both mitral and tricuspid valves: Secondary | ICD-10-CM | POA: Diagnosis not present

## 2023-02-03 LAB — ECHOCARDIOGRAM COMPLETE
AR max vel: 2.51 cm2
AV Area VTI: 2.3 cm2
AV Area mean vel: 2.05 cm2
AV Mean grad: 4 mm[Hg]
AV Peak grad: 6.7 mm[Hg]
Ao pk vel: 1.29 m/s
Area-P 1/2: 3.28 cm2
Calc EF: 71.5 %
MV VTI: 3.2 cm2
S' Lateral: 2.2 cm
Single Plane A2C EF: 75.7 %
Single Plane A4C EF: 61.2 %

## 2023-02-03 NOTE — Progress Notes (Signed)
  Echocardiogram 2D Echocardiogram has been performed.  Ocie Doyne RDCS 02/03/2023, 1:38 PM

## 2023-02-09 DIAGNOSIS — H524 Presbyopia: Secondary | ICD-10-CM | POA: Diagnosis not present

## 2023-02-09 DIAGNOSIS — H5213 Myopia, bilateral: Secondary | ICD-10-CM | POA: Diagnosis not present

## 2023-02-26 ENCOUNTER — Ambulatory Visit
Admission: RE | Admit: 2023-02-26 | Discharge: 2023-02-26 | Disposition: A | Discharge status: Home / Self Care | Payer: Medicare HMO | Source: Ambulatory Visit | Attending: Radiation Oncology | Admitting: Radiation Oncology

## 2023-02-26 DIAGNOSIS — C50411 Malignant neoplasm of upper-outer quadrant of right female breast: Secondary | ICD-10-CM

## 2023-02-26 NOTE — Progress Notes (Addendum)
      Tia Alert had a telephone follow-up today Following radiation to her rt breast.First Treatment Date: 2022-12-29 - Last Treatment Date: 2023-01-23   Pain: denies any pain Skin:  States that her skin is healing well. States that she is using a vitamin e cream  ROM:  Denies any issues with range of motion. Lymphedema:  denies  any swelling MedOnc F/U:  She has an appointment 04/22/23 with Mardella Layman. Other issues of note:  Denies any other issues  Pt reports Yes No Comments  Tamoxifen []  [x]    Letrozole []  [x]    Anastrazole []  [x]    Mammogram []  Date: 8/24 []     States that she will schedule her mammogram for 2025 closer to August. Writer advised patient if she has any issues that she can reach out to [place] . States that tamoxifen was ordered but the pharmacy was out when she went to get it.Advised patient to use a cream with vitamin e .

## 2023-04-08 DIAGNOSIS — E1151 Type 2 diabetes mellitus with diabetic peripheral angiopathy without gangrene: Secondary | ICD-10-CM | POA: Diagnosis not present

## 2023-04-08 DIAGNOSIS — I739 Peripheral vascular disease, unspecified: Secondary | ICD-10-CM | POA: Diagnosis not present

## 2023-04-08 DIAGNOSIS — L84 Corns and callosities: Secondary | ICD-10-CM | POA: Diagnosis not present

## 2023-04-08 DIAGNOSIS — L603 Nail dystrophy: Secondary | ICD-10-CM | POA: Diagnosis not present

## 2023-04-22 ENCOUNTER — Inpatient Hospital Stay: Payer: Medicare HMO | Attending: Radiation Oncology | Admitting: Adult Health

## 2023-04-22 DIAGNOSIS — Z170 Estrogen receptor positive status [ER+]: Secondary | ICD-10-CM | POA: Diagnosis not present

## 2023-04-22 DIAGNOSIS — C50411 Malignant neoplasm of upper-outer quadrant of right female breast: Secondary | ICD-10-CM | POA: Diagnosis not present

## 2023-04-22 NOTE — Progress Notes (Unsigned)
 SURVIVORSHIP VISIT:  I connected with Melissa Glenn on 04/24/23 at 10:00 AM EST by telephone and verified that I am speaking with the correct person using two identifiers.  I discussed the limitations, risks, security and privacy concerns of performing an evaluation and management service by telephone and the availability of in person appointments.  I also discussed with the patient that there may be a patient responsible charge related to this service. The patient expressed understanding and agreed to proceed.   Patient location: home Provider location: Home   BRIEF ONCOLOGIC HISTORY:  Oncology History  Malignant neoplasm of upper-outer quadrant of right breast in female, estrogen receptor positive (HCC)  11/10/2022 Initial Diagnosis   Malignant neoplasm of upper-outer quadrant of right breast in female, estrogen receptor positive (HCC)   11/20/2022 Genetic Testing   Negative [facility] CancerNext +RNAinsight Panel.  Report date is 11/20/2022.   The [facility] CancerNext+RNAinsight Panel includes sequencing, rearrangement analysis, and RNA analysis for the following 34 genes: APC, ATM, BARD1, BMPR1A, BRCA1, BRCA2, BRIP1, CDH1, CDK4, CDKN2A, CHEK2, DICER1, MLH1, MSH2, MSH6, MUTYH, NF1, NTHL1, PALB2, PMS2, PTEN, RAD51C, RAD51D, SMAD4, SMARCA4, STK11 and TP53 (sequencing and deletion/duplication); AXIN2, HOXB13, MSH3, POLD1 and POLE (sequencing only); EPCAM and GREM1 (deletion/duplication only).   12/29/2022 - 01/23/2023 Radiation Therapy   Plan Name: Breast_R Site: Breast, Right Technique: 3D Mode: Photon Dose Per Fraction: 2.67 Gy Prescribed Dose (Delivered / Prescribed): 40.05 Gy / 40.05 Gy Prescribed Fxs (Delivered / Prescribed): 15 / 15   Plan Name: Breast_R_Bst Site: Breast, Right Technique: 3D Mode: Photon Dose Per Fraction: 2 Gy Prescribed Dose (Delivered / Prescribed): 10 Gy / 10 Gy Prescribed Fxs (Delivered / Prescribed): 5 / 5   02/2023 -  Anti-estrogen oral therapy    tamoxifen     INTERVAL HISTORY:  [name] to review her survivorship care plan detailing her treatment course for breast cancer, as well as monitoring long-term side effects of that treatment, education regarding health maintenance, screening, and overall wellness and health promotion.     Overall, [name] reports feeling quite well.  She is taking tamoxifen daily.  She has chronic arthralgias but otherwise is tolerating the anastrozole well.    REVIEW OF SYSTEMS:  Review of Systems  Constitutional:  Negative for appetite change, chills, fatigue, fever and unexpected weight change.  HENT:   Negative for hearing loss, lump/mass and trouble swallowing.   Eyes:  Negative for eye problems and icterus.  Respiratory:  Negative for chest tightness, cough and shortness of breath.   Cardiovascular:  Negative for chest pain, leg swelling and palpitations.  Gastrointestinal:  Negative for abdominal distention, abdominal pain, constipation, diarrhea, nausea and vomiting.  Endocrine: Negative for hot flashes.  Genitourinary:  Negative for difficulty urinating.   Musculoskeletal:  Positive for arthralgias (chronic since prior to breast cancer diagnosis).  Skin:  Negative for itching and rash.  Neurological:  Negative for dizziness, extremity weakness, headaches and numbness.  Hematological:  Negative for adenopathy. Does not bruise/bleed easily.  Psychiatric/Behavioral:  Negative for depression. The patient is not nervous/anxious.    Breast: Denies any new nodularity, masses, tenderness, nipple changes, or nipple discharge.       PAST MEDICAL/SURGICAL HISTORY:  Past Medical History:  Diagnosis Date   Abnormality of left breast on screening mammogram 01/04/2019   Arthritis    mild -generalized   Cancer (HCC)    Complication of anesthesia 07-19-12   note with chart DOS 01-11-86, "small mouth"   Diabetes mellitus  TYPE 2   GERD (gastroesophageal reflux disease)    High cholesterol     Hypertension    Postmenopausal    Varicose veins of leg with swelling 2012   bilateral lower extremity Varicose Veins w/ swelling/pain   Vitamin D deficiency    Past Surgical History:  Procedure Laterality Date   APPENDECTOMY  1965   BREAST BIOPSY Right 11/04/2022   [place] RT BREAST BX W LOC DEV 1ST LESION IMG BX SPEC US GUIDE 11/04/2022 [facility] MAMMOGRAPHY   BREAST LUMPECTOMY Right 11/20/2022   Procedure: RIGHT BREAST LUMPECTOMY;  Surgeon: Melissa Loron, MD;  Location[facility];  Service: General;  Laterality: Right;   BREAST LUMPECTOMY WITH RADIOACTIVE SEED LOCALIZATION Left 01/04/2019   Procedure: LEFT BREAST LUMPECTOMY WITH RADIOACTIVE SEED LOCALIZATION;  Surgeon: Melissa Kelp, MD;  Location: [facility] OR;  Service: General;  Laterality: Left;   CATARACT EXTRACTION, BILATERAL  2007   CHOLECYSTECTOMY  1980   EYE SURGERY     KNEE ARTHROSCOPY  2008   right   PUBOVAGINAL SLING N/A 07/23/2012   Procedure: Pubo-Vaginal Solyx Sling;  Surgeon: Melissa Ludwig, MD;  Location: [facility] ORS;  Service: Urology;  Laterality: N/A;   SIGMOIDOSCOPY  2004   TONSILLECTOMY     TUBAL LIGATION  1965   VAGINAL PROLAPSE REPAIR N/A 07/23/2012   Procedure: UP HOLD LITE ANTERIOR VAULT SUSPENSION;  Surgeon: Melissa Ludwig, MD;  Location: [facility] OR[facility];  Service: Urology;  Laterality: N/A;     ALLERGIES:  Allergies  Allergen Reactions   Codeine Other (See Comments)    Dizziness   Lorcet [Hydrocodone-Acetaminophen] Nausea And Vomiting   Sulfa Antibiotics Other (See Comments) and Tinitus    DIZZINESS     CURRENT MEDICATIONS:  Outpatient Encounter Medications as of 04/22/2023  Medication Sig   acetaminophen (TYLENOL) 325 MG tablet Take 650 mg by mouth every 6 (six) hours as needed for moderate pain (pain).   aspirin EC 81 MG tablet Take 81 mg by mouth daily after supper.   Calcium Citrate-Vitamin D (CALCIUM CITRATE + D3 PO) Take 1 tablet by mouth every evening.   cholecalciferol (VITAMIN D) 1000 UNITS tablet Take  1,000 Units by mouth in the morning, at noon, and at bedtime.   glucose blood test strip 1 each by Other route as needed. Use as instructed   hydrocortisone cream 1 % Apply 1 Application topically 2 (two) times daily as needed (skin irritation/itching).   losartan (COZAAR) 50 MG tablet Take 50 mg by mouth every evening.   metFORMIN (GLUCOPHAGE-XR) 500 MG 24 hr tablet Take 1,000 mg by mouth 2 (two) times daily.   rOPINIRole (REQUIP) 0.5 MG tablet Take 0.5 mg by mouth at bedtime.   tamoxifen (NOLVADEX) 20 MG tablet Take 1 tablet (20 mg total) by mouth daily. (Patient not taking: Reported on 02/26/2023)   No facility-administered encounter medications on file as of 04/22/2023.     ONCOLOGIC FAMILY HISTORY:  Family History  Problem Relation Age of Onset   Stroke Mother    Stroke Maternal Grandmother    Hyperlipidemia Maternal Aunt    Breast cancer Neg Hx      SOCIAL HISTORY:  Social History   Socioeconomic History   Marital status: Widowed    Spouse name: Not on file   Number of children: Not on file   Years of education: Not on file   Highest education level: Not on file  Occupational History   Not on file  Tobacco Use   Smoking  status: Never   Smokeless tobacco: Never  Substance and Sexual Activity   Alcohol use: Yes    Comment: WINE-monthly   Drug use: No   Sexual activity: Never  Other Topics Concern   Not on file  Social History Narrative   Not on file   Social Drivers of Health   Financial Resource Strain: Not on file  Food Insecurity: No Food Insecurity (11/20/2022)   Hunger Vital Sign    Worried About Running Out of Food in the Last Year: Never true    Ran Out of Food in the Last Year: Never true  Transportation Needs: No Transportation Needs (11/20/2022)   PRAPARE - Administrator, Civil Service (Medical): No    Lack of Transportation (Non-Medical): No  Physical Activity: Not on file  Stress: Not on file  Social Connections: Not on file   Intimate Partner Violence: Not At Risk (11/20/2022)   Humiliation, Afraid, Rape, and Kick questionnaire    Fear of Current or Ex-Partner: No    Emotionally Abused: No    Physically Abused: No    Sexually Abused: No     OBSERVATIONS/OBJECTIVE:  Patient sounds well in no apparent distress, mood and behavior are normal.  Speech is normal.     LABORATORY DATA:  None for this visit.  DIAGNOSTIC IMAGING:  None for this visit.      ASSESSMENT AND PLAN:  Ms.. Melissa Glenn is a pleasant 89 y.o. female with Stage IIA right breast invasive ductal carcinoma, ER+/PR+/HER2-, diagnosed in 11/2022, treated with lumpectomy, adjuvant radiation therapy, and anti-estrogen therapy with TAmoxifen beginning in 02/2023.  She presents to the Survivorship Clinic for our initial meeting and routine follow-up post-completion of treatment for breast cancer.    1. Stage IIA right breast cancer:  [name] is continuing to recover from definitive treatment for breast cancer. She will follow-up with her medical oncologist, Dr.  Al Pimple in 6 months with history and physical exam per surveillance protocol.  She will continue her anti-estrogen therapy with tamoxifen. Thus far, she is tolerating the Tamoxifen well, with minimal side effects. Her mammogram is due 10/2023; orders placed today.   Today, a comprehensive survivorship care plan and treatment summary was reviewed with the patient today detailing her breast cancer diagnosis, treatment course, potential late/long-term effects of treatment, appropriate follow-up care with recommendations for the future, and patient education resources.  A copy of this summary, along with a letter will be sent to the patient's primary care provider via mail/fax/In Basket message after today's visit.    2. Bone health:    She was given education on specific activities to promote bone health.  3. Cancer screening:  Due to [name]'s history and her age, she should receive screening for  skin cancers.  The information and recommendations are listed on the patient's comprehensive care plan/treatment summary and were reviewed in detail with the patient.    4. Health maintenance and wellness promotion: [name] was encouraged to consume 5-7 servings of fruits and vegetables per day. We reviewed the "Nutrition Rainbow" handout.  She was also encouraged to engage in moderate to vigorous exercise for 30 minutes per day most days of the week.  She was instructed to limit her alcohol consumption and continue to abstain from tobacco use.     5. Support services/counseling: It is not uncommon for this period of the patient's cancer care trajectory to be one of many emotions and stressors.   She was given information  regarding our available services and encouraged to contact me with any questions or for help enrolling in any of our support group/programs.    Follow up instructions:    -Return to cancer center in 6 months for f/u with Dr. Al Pimple  -Mammogram due in 10/2023 -She is welcome to return back to the Survivorship Clinic at any time; no additional follow-up needed at this time.  -Consider referral back to survivorship as a long-term survivor for continued surveillance  The patient was provided an opportunity to ask questions and all were answered. The patient agreed with the plan and demonstrated an understanding of the instructions.   The patient was advised to call back or seek an in-person evaluation if the symptoms worsen or if the condition fails to improve as anticipated.   I provided 30 minutes of non face-to-face telephone visit time during this encounter, and > 50% was spent counseling as documented under my assessment & plan.     Lillard Anes, NP 04/24/23 3:21 PM Medical Oncology and Hematology [facility] [address] [place], [place] [address] Tel. [phone]    Fax. [phone]  *Total Encounter Time as defined by the Centers for Medicare  and Medicaid Services includes, in addition to the face-to-face time of a patient visit (documented in the note above) non-face-to-face time: obtaining and reviewing outside history, ordering and reviewing medications, tests or procedures, care coordination (communications with other health care professionals or caregivers) and documentation in the medical record.

## 2023-04-23 ENCOUNTER — Telehealth: Payer: Self-pay | Admitting: Adult Health

## 2023-04-23 NOTE — Telephone Encounter (Signed)
Scheduled appointment per 2/20 los. Patient is aware of the made appointment.

## 2023-05-14 DIAGNOSIS — E1165 Type 2 diabetes mellitus with hyperglycemia: Secondary | ICD-10-CM | POA: Diagnosis not present

## 2023-05-14 DIAGNOSIS — I10 Essential (primary) hypertension: Secondary | ICD-10-CM | POA: Diagnosis not present

## 2023-05-19 DIAGNOSIS — G2581 Restless legs syndrome: Secondary | ICD-10-CM | POA: Diagnosis not present

## 2023-05-19 DIAGNOSIS — E559 Vitamin D deficiency, unspecified: Secondary | ICD-10-CM | POA: Diagnosis not present

## 2023-05-19 DIAGNOSIS — C50411 Malignant neoplasm of upper-outer quadrant of right female breast: Secondary | ICD-10-CM | POA: Diagnosis not present

## 2023-05-19 DIAGNOSIS — Z0000 Encounter for general adult medical examination without abnormal findings: Secondary | ICD-10-CM | POA: Diagnosis not present

## 2023-05-19 DIAGNOSIS — I10 Essential (primary) hypertension: Secondary | ICD-10-CM | POA: Diagnosis not present

## 2023-05-19 DIAGNOSIS — E785 Hyperlipidemia, unspecified: Secondary | ICD-10-CM | POA: Diagnosis not present

## 2023-05-19 DIAGNOSIS — M810 Age-related osteoporosis without current pathological fracture: Secondary | ICD-10-CM | POA: Diagnosis not present

## 2023-05-19 DIAGNOSIS — G4762 Sleep related leg cramps: Secondary | ICD-10-CM | POA: Diagnosis not present

## 2023-05-19 DIAGNOSIS — E1121 Type 2 diabetes mellitus with diabetic nephropathy: Secondary | ICD-10-CM | POA: Diagnosis not present

## 2023-05-29 ENCOUNTER — Encounter: Payer: Self-pay | Admitting: Internal Medicine

## 2023-07-09 ENCOUNTER — Ambulatory Visit: Admitting: Podiatry

## 2023-07-17 ENCOUNTER — Ambulatory Visit: Admitting: Podiatry

## 2023-10-22 ENCOUNTER — Inpatient Hospital Stay: Payer: Medicare HMO | Attending: Hematology and Oncology | Admitting: Hematology and Oncology

## 2023-10-22 VITALS — BP 124/54 | HR 73 | Temp 97.9°F | Resp 14 | Wt 126.1 lb

## 2023-10-22 DIAGNOSIS — L409 Psoriasis, unspecified: Secondary | ICD-10-CM

## 2023-10-22 DIAGNOSIS — Z79810 Long term (current) use of selective estrogen receptor modulators (SERMs): Secondary | ICD-10-CM | POA: Insufficient documentation

## 2023-10-22 DIAGNOSIS — Z170 Estrogen receptor positive status [ER+]: Secondary | ICD-10-CM | POA: Diagnosis not present

## 2023-10-22 DIAGNOSIS — C50411 Malignant neoplasm of upper-outer quadrant of right female breast: Secondary | ICD-10-CM | POA: Insufficient documentation

## 2023-10-22 DIAGNOSIS — R634 Abnormal weight loss: Secondary | ICD-10-CM | POA: Insufficient documentation

## 2023-10-22 MED ORDER — TRIAMCINOLONE ACETONIDE 0.1 % EX LOTN: 1.0000 | TOPICAL_LOTION | Freq: Two times a day (BID) | CUTANEOUS | 1 refills | Status: AC

## 2023-10-22 NOTE — Progress Notes (Signed)
 BRIEF ONCOLOGIC HISTORY:  Oncology History  Malignant neoplasm of upper-outer quadrant of right breast in female, estrogen receptor positive (HCC)  11/10/2022 Initial Diagnosis   Malignant neoplasm of upper-outer quadrant of right breast in female, estrogen receptor positive (HCC)   11/20/2022 Genetic Testing   Negative [facility] CancerNext +RNAinsight Panel.  Report date is 11/20/2022.   The [facility] CancerNext+RNAinsight Panel includes sequencing, rearrangement analysis, and RNA analysis for the following 34 genes: APC, ATM, BARD1, BMPR1A, BRCA1, BRCA2, BRIP1, CDH1, CDK4, CDKN2A, CHEK2, DICER1, MLH1, MSH2, MSH6, MUTYH, NF1, NTHL1, PALB2, PMS2, PTEN, RAD51C, RAD51D, SMAD4, SMARCA4, STK11 and TP53 (sequencing and deletion/duplication); AXIN2, HOXB13, MSH3, POLD1 and POLE (sequencing only); EPCAM and GREM1 (deletion/duplication only).   12/29/2022 - 01/23/2023 Radiation Therapy   Plan Name: Breast_R Site: Breast, Right Technique: 3D Mode: Photon Dose Per Fraction: 2.67 Gy Prescribed Dose (Delivered / Prescribed): 40.05 Gy / 40.05 Gy Prescribed Fxs (Delivered / Prescribed): 15 / 15   Plan Name: Breast_R_Bst Site: Breast, Right Technique: 3D Mode: Photon Dose Per Fraction: 2 Gy Prescribed Dose (Delivered / Prescribed): 10 Gy / 10 Gy Prescribed Fxs (Delivered / Prescribed): 5 / 5   02/2023 -  Anti-estrogen oral therapy   tamoxifen      INTERVAL HISTORY:   Discussed the use of AI scribe software for clinical note transcription with the patient, who gave verbal consent to proceed.  History of Present Illness Melissa Glenn is a 89 year old female with breast cancer who presents with weight loss and skin issues.  She has experienced significant weight loss of approximately fifteen pounds. She reports decreased appetite and difficulty chewing. Despite preparing meals, she often cannot finish them and discards leftovers. She has previously consulted a nutritionist but continues to  struggle with maintaining her weight. She feels generally well despite the weight loss.  She is experiencing a persistent skin condition characterized by dryness and itchiness, particularly on her back. Various lotions have been ineffective. She suspects that tamoxifen , which she started in December of last year, may have worsened the condition. There is a family history of psoriasis, as one of her daughters was diagnosed with it at a young age.  She reports nerve pain in her legs that varies in intensity, sometimes requiring her to walk around the house to alleviate discomfort. Additionally, she feels bloated and has a protruding stomach despite her weight loss, which she finds puzzling given her reduced food intake.  Socially, her son has moved in with her. She also has a new kitten, which she describes as very small and active, adding some liveliness to her home environment.  ROS as mentioned above    PAST MEDICAL/SURGICAL HISTORY:  Past Medical History:  Diagnosis Date   Abnormality of left breast on screening mammogram 01/04/2019   Arthritis    mild -generalized   Cancer (HCC)    Complication of anesthesia 07-19-12   note with chart DOS 01-11-86, small mouth   Diabetes mellitus    TYPE 2   GERD (gastroesophageal reflux disease)    High cholesterol    Hypertension    Postmenopausal    Varicose veins of leg with swelling 2012   bilateral lower extremity Varicose Veins w/ swelling/pain   Vitamin D deficiency    Past Surgical History:  Procedure Laterality Date   APPENDECTOMY  1965   BREAST BIOPSY Right 11/04/2022   US  RT BREAST BX W LOC DEV 1ST LESION IMG BX SPEC US  GUIDE 11/04/2022 [facility] MAMMOGRAPHY   BREAST LUMPECTOMY  Right 11/20/2022   Procedure: RIGHT BREAST LUMPECTOMY;  Surgeon: Ebbie Cough, MD;  Location: [facility] OR;  Service: General;  Laterality: Right;   BREAST LUMPECTOMY WITH RADIOACTIVE SEED LOCALIZATION Left 01/04/2019   Procedure: LEFT BREAST LUMPECTOMY WITH  RADIOACTIVE SEED LOCALIZATION;  Surgeon: Gail Favorite, MD;  Location: [facility] OR;  Service: General;  Laterality: Left;   CATARACT EXTRACTION, BILATERAL  2007   CHOLECYSTECTOMY  1980   EYE SURGERY     KNEE ARTHROSCOPY  2008   right   PUBOVAGINAL SLING N/A 07/23/2012   Procedure: Pubo-Vaginal Solyx Sling;  Surgeon: Arlena LILLETTE Gal, MD;  Location: [facility];  Service: Urology;  Laterality: N/A;   SIGMOIDOSCOPY  2004   TONSILLECTOMY     TUBAL LIGATION  1965   VAGINAL PROLAPSE REPAIR N/A 07/23/2012   Procedure: UP HOLD LITE ANTERIOR VAULT SUSPENSION;  Surgeon: Arlena LILLETTE Gal, MD;  Location: [facility];  Service: Urology;  Laterality: N/A;     ALLERGIES:  Allergies  Allergen Reactions   Codeine Other (See Comments)    Dizziness   Lorcet [Hydrocodone -Acetaminophen ] Nausea And Vomiting   Sulfa Antibiotics Other (See Comments) and Tinitus    DIZZINESS     CURRENT MEDICATIONS:  Outpatient Encounter Medications as of 10/22/2023  Medication Sig   acetaminophen  (TYLENOL ) 325 MG tablet Take 650 mg by mouth every 6 (six) hours as needed for moderate pain (pain).   aspirin EC 81 MG tablet Take 81 mg by mouth daily after supper.   Calcium Citrate-Vitamin D (CALCIUM CITRATE + D3 PO) Take 1 tablet by mouth every evening.   cholecalciferol (VITAMIN D) 1000 UNITS tablet Take 1,000 Units by mouth in the morning, at noon, and at bedtime.   glucose blood test strip 1 each by Other route as needed. Use as instructed   losartan  (COZAAR ) 50 MG tablet Take 50 mg by mouth every evening.   metFORMIN  (GLUCOPHAGE -XR) 500 MG 24 hr tablet Take 1,000 mg by mouth 2 (two) times daily.   rOPINIRole  (REQUIP ) 0.5 MG tablet Take 0.5 mg by mouth at bedtime.   tamoxifen  (NOLVADEX ) 20 MG tablet Take 1 tablet (20 mg total) by mouth daily.   hydrocortisone cream 1 % Apply 1 Application topically 2 (two) times daily as needed (skin irritation/itching). (Patient not taking: Reported on 10/22/2023)   No facility-administered  encounter medications on file as of 10/22/2023.     ONCOLOGIC FAMILY HISTORY:  Family History  Problem Relation Age of Onset   Stroke Mother    Stroke Maternal Grandmother    Hyperlipidemia Maternal Aunt    Breast cancer Neg Hx      SOCIAL HISTORY:  Social History   Socioeconomic History   Marital status: Widowed    Spouse name: Not on file   Number of children: Not on file   Years of education: Not on file   Highest education level: Not on file  Occupational History   Not on file  Tobacco Use   Smoking status: Never   Smokeless tobacco: Never  Substance and Sexual Activity   Alcohol use: Yes    Comment: WINE-monthly   Drug use: No   Sexual activity: Never  Other Topics Concern   Not on file  Social History Narrative   Not on file   Social Drivers of Health   Financial Resource Strain: Not on file  Food Insecurity: No Food Insecurity (11/20/2022)   Hunger Vital Sign    Worried About Running Out of Food in the Last Year:  Never true    Ran Out of Food in the Last Year: Never true  Transportation Needs: No Transportation Needs (11/20/2022)   PRAPARE - Administrator, Civil Service (Medical): No    Lack of Transportation (Non-Medical): No  Physical Activity: Not on file  Stress: Not on file  Social Connections: Not on file  Intimate Partner Violence: Not At Risk (11/20/2022)   Humiliation, Afraid, Rape, and Kick questionnaire    Fear of Current or Ex-Partner: No    Emotionally Abused: No    Physically Abused: No    Sexually Abused: No     OBSERVATIONS/OBJECTIVE:  Patient sounds well in no apparent distress, mood and behavior are normal.  Speech is normal.     Physical Exam Constitutional:      Appearance: Normal appearance.  Chest:     Comments: Bilateral breasts inspected. No palpable mass. No regional adenopathy Musculoskeletal:     Cervical back: Normal range of motion and neck supple.  Neurological:     Mental Status: She is alert.       LABORATORY DATA:  None for this visit.  DIAGNOSTIC IMAGING:  None for this visit.      ASSESSMENT AND PLAN:  Ms.. Mulhall is a pleasant 89 y.o. female with Stage IIA right breast invasive ductal carcinoma, ER+/PR+/HER2-, diagnosed in 11/2022, treated with lumpectomy, adjuvant radiation therapy, and anti-estrogen therapy with TAmoxifen  beginning in 02/2023.    Assessment and Plan Assessment & Plan Unintentional weight loss and decreased appetite in the elderly Significant weight loss likely due to decreased appetite from aging. No acute illness identified. Continue to work on adequate caloric intake, could consider energy drinks such as boost/ensure.  ?Psoriasis with pruritus Severe pruritus and dry skin, Clinical presentation  suggest psoriasis. - Prescribed Kenalog  lotion for pruritic areas. - Referred to dermatologist for further evaluation and management.  History of breast cancer, status post surgery, on tamoxifen  Post-surgical soreness and lumps attributed to changes, no recurrence concern. Mammogram scheduled for further evaluation. - Continue tamoxifen  therapy. - Monitor breast changes and follow up with mammogram results.  I provided 30 minutes of non face-to-face telephone visit time during this encounter, and > 50% was spent counseling as documented under my assessment & plan.  *Total Encounter Time as defined by the Centers for Medicare and Medicaid Services includes, in addition to the face-to-face time of a patient visit (documented in the note above) non-face-to-face time: obtaining and reviewing outside history, ordering and reviewing medications, tests or procedures, care coordination (communications with other health care professionals or caregivers) and documentation in the medical record.

## 2023-10-30 ENCOUNTER — Ambulatory Visit
Admission: RE | Admit: 2023-10-30 | Discharge: 2023-10-30 | Disposition: A | Discharge status: Home / Self Care | Payer: Medicare HMO | Source: Ambulatory Visit | Attending: Adult Health | Admitting: Adult Health

## 2023-10-30 DIAGNOSIS — R928 Other abnormal and inconclusive findings on diagnostic imaging of breast: Secondary | ICD-10-CM | POA: Diagnosis not present

## 2023-10-30 DIAGNOSIS — Z170 Estrogen receptor positive status [ER+]: Secondary | ICD-10-CM

## 2023-10-30 DIAGNOSIS — Z853 Personal history of malignant neoplasm of breast: Secondary | ICD-10-CM | POA: Diagnosis not present

## 2023-12-07 ENCOUNTER — Other Ambulatory Visit: Payer: Self-pay | Admitting: *Deleted

## 2023-12-07 MED ORDER — TAMOXIFEN CITRATE 20 MG PO TABS: 20.0000 mg | ORAL_TABLET | Freq: Every day | ORAL | 3 refills | Status: AC

## 2023-12-31 DIAGNOSIS — C50411 Malignant neoplasm of upper-outer quadrant of right female breast: Secondary | ICD-10-CM | POA: Diagnosis not present

## 2023-12-31 DIAGNOSIS — Z170 Estrogen receptor positive status [ER+]: Secondary | ICD-10-CM | POA: Diagnosis not present

## 2024-02-16 ENCOUNTER — Telehealth: Payer: Self-pay | Admitting: Hematology and Oncology

## 2024-02-16 NOTE — Telephone Encounter (Signed)
 I spoke w patient regarding 04/25/24 appt being rescheduled to 04/26/24. Patient is aware of new appt date and time

## 2024-03-08 ENCOUNTER — Ambulatory Visit (INDEPENDENT_AMBULATORY_CARE_PROVIDER_SITE_OTHER)

## 2024-03-08 ENCOUNTER — Ambulatory Visit: Admitting: Podiatry

## 2024-03-08 DIAGNOSIS — S9031XA Contusion of right foot, initial encounter: Secondary | ICD-10-CM | POA: Diagnosis not present

## 2024-03-08 DIAGNOSIS — M79672 Pain in left foot: Secondary | ICD-10-CM

## 2024-03-08 DIAGNOSIS — B351 Tinea unguium: Secondary | ICD-10-CM | POA: Diagnosis not present

## 2024-03-08 DIAGNOSIS — M79671 Pain in right foot: Secondary | ICD-10-CM | POA: Diagnosis not present

## 2024-03-08 DIAGNOSIS — S92531A Displaced fracture of distal phalanx of right lesser toe(s), initial encounter for closed fracture: Secondary | ICD-10-CM | POA: Diagnosis not present

## 2024-03-08 NOTE — Progress Notes (Signed)
 Patient presents today with complaint of dropping a wine bottle on second toe of her right foot with pain.  Dated on February 25, 2024.  Dropped a wine bottle on the toe.  Got red and swollen with bleeding.  No fever chills nausea or vomiting.  Patient also presents for evaluation and treatment of tenderness and some redness around nails feet.  Tenderness around toes with walking and wearing shoes.  Physical exam:  General appearance: Alert, pleasant, and in no acute distress.  Vascular: Pedal pulses: DP 2/4 B/L, PT 1/4 B/L. Mild edema lower legs bilaterally.  Capillary refill time immediate bilaterally  Neurologic:  Dermatologic:  Nails thickened, disfigured, discolored 1-5 BL with subungual debris.  Redness and hypertrophic nail folds along nail folds bilaterally but no signs of drainage or infection.  Ecchymosis distal second toe right  Musculoskeletal:  Tenderness distal phalanx and DIPJ of second toe right.  Edema distal aspect second toe right  Radiographs: 3 views foot right: Comminuted fracture distal phalanx second toe right.  No subcutaneous emphysema noted.  No signs of osteomyelitis.  Diagnosis: 1. Painful onychomycotic nails 1 through 5 bilaterally. 2. Pain toes 1 through 5 bilaterally. 3.  Closed comminuted fracture distal phalanx second toe right, initial visit  Plan: -Initiate closed treatment comminuted fracture distal phalanx second toe right.  RICE.  Wear closed toed shoe for the next 4 weeks.  Watch for any signs of infection.  If any concerns or problems she should call us  immediately.  -Debrided onychomycotic nails 1 through 5 bilaterally.  Sharply debrided nails with nail clipper and reduced with a power bur.  Return 3 months [facility]

## 2024-04-25 ENCOUNTER — Ambulatory Visit: Admitting: Hematology and Oncology

## 2024-04-26 ENCOUNTER — Inpatient Hospital Stay: Admitting: Hematology and Oncology

## 2024-06-01 ENCOUNTER — Ambulatory Visit: Admitting: Dermatology

## 2024-06-06 ENCOUNTER — Ambulatory Visit: Admitting: Podiatry
